# Patient Record
Sex: Female | Born: 1995 | Race: White | Hispanic: No | Smoking: Never smoker
Health system: Southern US, Community
[De-identification: ages and names within clinical notes are randomized; demographics above are authoritative.]

## PROBLEM LIST (undated history)

## (undated) DIAGNOSIS — I10 Essential (primary) hypertension: Secondary | ICD-10-CM

## (undated) DIAGNOSIS — G43909 Migraine, unspecified, not intractable, without status migrainosus: Secondary | ICD-10-CM

## (undated) DIAGNOSIS — E119 Type 2 diabetes mellitus without complications: Secondary | ICD-10-CM

## (undated) DIAGNOSIS — J189 Pneumonia, unspecified organism: Secondary | ICD-10-CM

## (undated) DIAGNOSIS — J45909 Unspecified asthma, uncomplicated: Secondary | ICD-10-CM

## (undated) DIAGNOSIS — E282 Polycystic ovarian syndrome: Secondary | ICD-10-CM

## (undated) HISTORY — PX: OTHER SURGICAL HISTORY: SHX169

---

## 2020-04-23 ENCOUNTER — Emergency Department (HOSPITAL_COMMUNITY)
Admission: EM | Admit: 2020-04-23 | Discharge: 2020-04-24 | Disposition: A | Discharge status: Home / Self Care | Payer: No Typology Code available for payment source | Attending: Emergency Medicine | Admitting: Emergency Medicine

## 2020-04-23 ENCOUNTER — Emergency Department (HOSPITAL_COMMUNITY): Payer: No Typology Code available for payment source

## 2020-04-23 DIAGNOSIS — M79604 Pain in right leg: Secondary | ICD-10-CM

## 2020-04-23 DIAGNOSIS — M25559 Pain in unspecified hip: Secondary | ICD-10-CM

## 2020-04-23 DIAGNOSIS — M25561 Pain in right knee: Secondary | ICD-10-CM | POA: Diagnosis not present

## 2020-04-23 DIAGNOSIS — M25571 Pain in right ankle and joints of right foot: Secondary | ICD-10-CM | POA: Diagnosis not present

## 2020-04-23 DIAGNOSIS — W109XXA Fall (on) (from) unspecified stairs and steps, initial encounter: Secondary | ICD-10-CM

## 2020-04-23 DIAGNOSIS — W108XXA Fall (on) (from) other stairs and steps, initial encounter: Secondary | ICD-10-CM | POA: Diagnosis not present

## 2020-04-23 LAB — I-STAT BETA HCG BLOOD, ED (MC, WL, AP ONLY): I-stat hCG, quantitative: <5 m[IU]/mL (ref <5)

## 2020-04-23 NOTE — ED Triage Notes (Signed)
Pt reports trip over an stair and landed on right side of body, Pt is complaining pf right ankle, Knee and hip pain. Pt denies any LOC during fall

## 2020-04-24 DIAGNOSIS — M79604 Pain in right leg: Secondary | ICD-10-CM | POA: Diagnosis not present

## 2020-04-24 NOTE — Discharge Instructions (Signed)
Tylenol or motrin for pain.  Can try to elevate and ice affected areas. Use crutches for now, progress back to weight bearing as tolerated. Follow-up with orthopedics if needed, otherwise can see your primary care doctor. Return here for new concerns.

## 2020-04-24 NOTE — ED Provider Notes (Signed)
[facility] EMERGENCY DEPARTMENT Provider Note   CSN: [id number] Arrival date & time: 04/23/20  2221     History Chief Complaint  Patient presents with  . Ankle Pain  . Knee Pain  . Fall  . Hip Pain    Susan Butler is a 25 y.o. female.  The history is provided by the patient and medical records.  Ankle Pain Knee Pain Fall  Hip Pain   24 y.o. F presenting to the ED for right leg pain after a fall.  Patient states she was messing around with a friend, lost her footing and fell into some steps with her right side.  States majority of impact on right lower leg.  No head injury or LOC.  States majority of pain in right knee and ankle but hip started hurting afterwards.  She states she cannot bear weight on right ankle at all now.  Denies numbness/weakness.  She has had some ankle sprains on right side before.  No meds PTA.  No past medical history on file.  There are no problems to display for this patient.    OB History   No obstetric history on file.     No family history on file.     Home Medications Prior to Admission medications   Not on File    Allergies    Patient has no known allergies.  Review of Systems   Review of Systems  Musculoskeletal: Positive for arthralgias.  All other systems reviewed and are negative.   Physical Exam Updated Vital Signs BP (!) 138/104   Pulse 89   Temp 98.2 F (36.8 C)   Resp 20   Ht 5\' 11"  (1.803 m)   Wt 104.3 kg   SpO2 100%   BMI 32.08 kg/m   Physical Exam Vitals and nursing note reviewed.  Constitutional:      Appearance: She is well-developed. She is obese.     Comments: obese  HENT:     Head: Normocephalic and atraumatic.  Eyes:     Conjunctiva/sclera: Conjunctivae normal.     Pupils: Pupils are equal, round, and reactive to light.  Cardiovascular:     Rate and Rhythm: Normal rate and regular rhythm.     Heart sounds: Normal heart sounds.  Pulmonary:     Effort:  Pulmonary effort is normal. No respiratory distress.     Breath sounds: Normal breath sounds. No rhonchi.  Abdominal:     General: Bowel sounds are normal.     Palpations: Abdomen is soft.     Tenderness: There is no abdominal tenderness. There is no rebound.  Musculoskeletal:        General: Normal range of motion.     Cervical back: Normal range of motion.     Comments: Right leg normal in appearance, no significant swelling or bony deformities present, no leg shortening, pain elicited with attempted ROM of the right knee and ankle, DP pulse intact, moving toes normally  Skin:    General: Skin is warm and dry.  Neurological:     Mental Status: She is alert and oriented to person, place, and time.     ED Results / Procedures / Treatments   Labs (all labs ordered are listed, but only abnormal results are displayed) Labs Reviewed  I-STAT BETA HCG BLOOD, ED ([facility], [facility], [facility] ONLY)    EKG None  Radiology DG Ankle Complete Right  Result Date: 04/24/2020 CLINICAL DATA:  25 year old female with right lower  extremity pain. EXAM: RIGHT ANKLE - COMPLETE 3+ VIEW COMPARISON:  None. FINDINGS: There is no evidence of fracture, dislocation, or joint effusion. There is no evidence of arthropathy or other focal bone abnormality. Soft tissues are unremarkable. IMPRESSION: Negative. Electronically Signed   By: Elgie Collard M.D.   On: 04/24/2020 00:15   DG Knee Complete 4 Views Right  Result Date: 04/24/2020 CLINICAL DATA:  Fall EXAM: RIGHT KNEE - COMPLETE 4+ VIEW COMPARISON:  None. FINDINGS: No evidence of fracture, dislocation, or joint effusion. Benign bone island in the proximal tibia. No evidence of arthropathy or other focal bone abnormality. Soft tissues are unremarkable. IMPRESSION: Negative. Electronically Signed   By: Kreg Shropshire M.D.   On: 04/24/2020 00:22   DG HIP UNILAT WITH PELVIS 2-3 VIEWS RIGHT  Result Date: 04/24/2020 CLINICAL DATA:  Fall, right hip pain EXAM: DG HIP (WITH OR  WITHOUT PELVIS) 2-3V RIGHT COMPARISON:  None. FINDINGS: There is no evidence of hip fracture or dislocation. There is no evidence of arthropathy or other focal bone abnormality. IMPRESSION: Negative. Electronically Signed   By: Kreg Shropshire M.D.   On: 04/24/2020 00:15    Procedures Procedures   Medications Ordered in ED Medications - No data to display  ED Course  I have reviewed the triage vital signs and the nursing notes.  Pertinent labs & imaging results that were available during my care of the patient were reviewed by me and considered in my medical decision making (see chart for details).    MDM Rules/Calculators/A&P  25 year old female presenting to the ED after a fall.  She lost her footing and fell into some steps, majority of impact on her right lower leg.  There was no head injury or loss of consciousness.  She complains of pain diffusely throughout the right leg but there is no leg shortening or bony deformity present.  Majority of her pain seems localized to right knee and ankle, pain is elicited with attempted range of motion.  Her leg is neurovascularly intact.  X-ray of right hip, knee, and ankle obtained and are all negative.  Suspect sprain type injuries.  She was placed in ASO, given knee sleeve, and crutches.  Will progress back to full weightbearing as tolerated.  She was given orthopedic follow-up if not improving, otherwise can see PCP.  Tylenol Motrin for pain, ice, elevation.  May return here for any new or acute changes.  Final Clinical Impression(s) / ED Diagnoses Final diagnoses:  Fall on stairs, initial encounter  Right leg pain    Rx / DC Orders ED Discharge Orders    None       Garlon Hatchet, PA-C 04/24/20 0417    Charlynne Pander, MD 04/24/20 [phone]

## 2020-04-24 NOTE — Progress Notes (Signed)
Orthopedic Tech Progress Note Patient Details:  Susan Butler [facility] 10-Mar-1995 [id number]  Ortho Devices Type of Ortho Device: ASO,Knee Sleeve,Crutches Ortho Device/Splint Location: Right Lower Extremity Ortho Device/Splint Interventions: Ordered,Application,Adjustment   Post Interventions Patient Tolerated: Well Instructions Provided: Adjustment of device,Care of device,Poper ambulation with device   [name] P [name] 04/24/2020, 4:20 AM

## 2020-04-24 NOTE — ED Notes (Signed)
Patient verbalizes understanding of discharge instructions. Opportunity for questioning and answers were provided. Armband removed by staff, pt discharged from ED via wheelchair.  

## 2020-06-14 DIAGNOSIS — F902 Attention-deficit hyperactivity disorder, combined type: Secondary | ICD-10-CM | POA: Diagnosis not present

## 2020-06-14 DIAGNOSIS — F4310 Post-traumatic stress disorder, unspecified: Secondary | ICD-10-CM | POA: Diagnosis not present

## 2020-06-22 DIAGNOSIS — I10 Essential (primary) hypertension: Secondary | ICD-10-CM | POA: Diagnosis not present

## 2020-06-22 DIAGNOSIS — R945 Abnormal results of liver function studies: Secondary | ICD-10-CM | POA: Diagnosis not present

## 2020-06-22 DIAGNOSIS — R000 Tachycardia, unspecified: Secondary | ICD-10-CM | POA: Diagnosis not present

## 2020-06-22 DIAGNOSIS — Z888 Allergy status to other drugs, medicaments and biological substances status: Secondary | ICD-10-CM | POA: Diagnosis not present

## 2020-06-22 DIAGNOSIS — R519 Headache, unspecified: Secondary | ICD-10-CM | POA: Diagnosis not present

## 2020-06-22 DIAGNOSIS — K760 Fatty (change of) liver, not elsewhere classified: Secondary | ICD-10-CM | POA: Diagnosis not present

## 2020-06-22 DIAGNOSIS — R600 Localized edema: Secondary | ICD-10-CM | POA: Diagnosis not present

## 2020-06-22 DIAGNOSIS — Z91030 Bee allergy status: Secondary | ICD-10-CM | POA: Diagnosis not present

## 2020-06-22 DIAGNOSIS — R7401 Elevation of levels of liver transaminase levels: Secondary | ICD-10-CM | POA: Diagnosis not present

## 2020-06-22 DIAGNOSIS — K5730 Diverticulosis of large intestine without perforation or abscess without bleeding: Secondary | ICD-10-CM | POA: Diagnosis not present

## 2020-06-22 DIAGNOSIS — Z91040 Latex allergy status: Secondary | ICD-10-CM | POA: Diagnosis not present

## 2020-06-22 DIAGNOSIS — R112 Nausea with vomiting, unspecified: Secondary | ICD-10-CM | POA: Diagnosis not present

## 2020-06-23 DIAGNOSIS — K5730 Diverticulosis of large intestine without perforation or abscess without bleeding: Secondary | ICD-10-CM | POA: Diagnosis not present

## 2020-06-23 DIAGNOSIS — Z91030 Bee allergy status: Secondary | ICD-10-CM | POA: Diagnosis not present

## 2020-06-23 DIAGNOSIS — R945 Abnormal results of liver function studies: Secondary | ICD-10-CM | POA: Diagnosis not present

## 2020-06-23 DIAGNOSIS — R000 Tachycardia, unspecified: Secondary | ICD-10-CM | POA: Diagnosis not present

## 2020-06-23 DIAGNOSIS — K760 Fatty (change of) liver, not elsewhere classified: Secondary | ICD-10-CM | POA: Diagnosis not present

## 2020-06-23 DIAGNOSIS — Z888 Allergy status to other drugs, medicaments and biological substances status: Secondary | ICD-10-CM | POA: Diagnosis not present

## 2020-06-23 DIAGNOSIS — R600 Localized edema: Secondary | ICD-10-CM | POA: Diagnosis not present

## 2020-06-23 DIAGNOSIS — Z91040 Latex allergy status: Secondary | ICD-10-CM | POA: Diagnosis not present

## 2020-06-23 DIAGNOSIS — R519 Headache, unspecified: Secondary | ICD-10-CM | POA: Diagnosis not present

## 2020-07-18 ENCOUNTER — Encounter (HOSPITAL_COMMUNITY): Payer: Self-pay

## 2020-07-18 ENCOUNTER — Emergency Department (HOSPITAL_COMMUNITY)
Admission: EM | Admit: 2020-07-18 | Discharge: 2020-07-19 | Disposition: A | Discharge status: Home / Self Care | Payer: Medicaid Other | Attending: Emergency Medicine | Admitting: Emergency Medicine

## 2020-07-18 ENCOUNTER — Emergency Department (HOSPITAL_COMMUNITY): Payer: Medicaid Other

## 2020-07-18 DIAGNOSIS — S8992XA Unspecified injury of left lower leg, initial encounter: Secondary | ICD-10-CM | POA: Diagnosis not present

## 2020-07-18 DIAGNOSIS — S99912A Unspecified injury of left ankle, initial encounter: Secondary | ICD-10-CM | POA: Insufficient documentation

## 2020-07-18 DIAGNOSIS — W19XXXA Unspecified fall, initial encounter: Secondary | ICD-10-CM

## 2020-07-18 DIAGNOSIS — W010XXA Fall on same level from slipping, tripping and stumbling without subsequent striking against object, initial encounter: Secondary | ICD-10-CM | POA: Insufficient documentation

## 2020-07-18 DIAGNOSIS — Y92512 Supermarket, store or market as the place of occurrence of the external cause: Secondary | ICD-10-CM | POA: Diagnosis not present

## 2020-07-18 NOTE — ED Triage Notes (Signed)
Pt states that she was at [employer] and slipped on some water and hurt her L knee and L ankle

## 2020-07-19 ENCOUNTER — Ambulatory Visit (HOSPITAL_COMMUNITY)
Admission: EM | Admit: 2020-07-19 | Discharge: 2020-07-19 | Disposition: A | Discharge status: Home / Self Care | Payer: Medicaid Other | Attending: Emergency Medicine | Admitting: Emergency Medicine

## 2020-07-19 ENCOUNTER — Other Ambulatory Visit: Payer: Self-pay

## 2020-07-19 ENCOUNTER — Encounter (HOSPITAL_COMMUNITY): Payer: Self-pay

## 2020-07-19 DIAGNOSIS — S8392XA Sprain of unspecified site of left knee, initial encounter: Secondary | ICD-10-CM | POA: Diagnosis not present

## 2020-07-19 DIAGNOSIS — M25572 Pain in left ankle and joints of left foot: Secondary | ICD-10-CM

## 2020-07-19 MED ORDER — NAPROXEN 500 MG PO TABS: 500.0000 mg | ORAL_TABLET | Freq: Two times a day (BID) | ORAL | 0 refills | Status: DC

## 2020-07-19 NOTE — ED Provider Notes (Signed)
[facility] EMERGENCY DEPARTMENT Provider Note   CSN: [id number] Arrival date & time: 07/18/20  2130     History Chief Complaint  Patient presents with   Susan Butler is a 25 y.o. female.  The history is provided by the patient and medical records.  Fall  24 y.o. F here after she slipped on some water at [facility] and injured left knee and ankle.  Struck her elbow on a wall but that just feels sore.  There was no head injury or LOC.  States just wanted to make sure she didn't break anything.  She remains ambulatory.  No intervention tried PTA.  History reviewed. No pertinent past medical history.  There are no problems to display for this patient.   History reviewed. No pertinent surgical history.   OB History   No obstetric history on file.     No family history on file.     Home Medications Prior to Admission medications   Not on File    Allergies    Bee venom, Latex, Lithium, and Seroquel [quetiapine]  Review of Systems   Review of Systems  Musculoskeletal:  Positive for arthralgias.  All other systems reviewed and are negative.  Physical Exam Updated Vital Signs BP (!) 135/93 (BP Location: Right Arm)   Pulse 81   Temp 98 F (36.7 C) (Oral)   Resp 17   SpO2 98%   Physical Exam Vitals and nursing note reviewed.  Constitutional:      Appearance: She is well-developed. She is obese.  HENT:     Head: Normocephalic and atraumatic.  Eyes:     Conjunctiva/sclera: Conjunctivae normal.     Pupils: Pupils are equal, round, and reactive to light.  Cardiovascular:     Rate and Rhythm: Normal rate and regular rhythm.     Heart sounds: Normal heart sounds.  Pulmonary:     Effort: Pulmonary effort is normal.     Breath sounds: Normal breath sounds.  Abdominal:     General: Bowel sounds are normal.     Palpations: Abdomen is soft.  Musculoskeletal:        General: Normal range of motion.     Cervical back: Normal  range of motion.     Comments: Left leg is entirely atraumatic without any swelling or bony deformities, no pain elicited with ROM, DP pulse intact, good distal strength and sensation, normal gait Elbow normal in appearance, no pain with ROM, radial pulse intact  Skin:    General: Skin is warm and dry.  Neurological:     Mental Status: She is alert and oriented to person, place, and time.    ED Results / Procedures / Treatments   Labs (all labs ordered are listed, but only abnormal results are displayed) Labs Reviewed - No data to display  EKG None  Radiology DG Ankle 2 Views Left  Result Date: 07/18/2020 CLINICAL DATA:  Left knee and ankle pain EXAM: LEFT ANKLE - 2 VIEW COMPARISON:  None. FINDINGS: There is no evidence of fracture, dislocation, or joint effusion. There is no evidence of arthropathy or other focal bone abnormality. Soft tissues are unremarkable. IMPRESSION: Negative. Electronically Signed   By: Charlett Nose M.D.   On: 07/18/2020 22:15   DG Knee Complete 4 Views Left  Result Date: 07/18/2020 CLINICAL DATA:  Left knee and ankle pain EXAM: LEFT KNEE - COMPLETE 4+ VIEW COMPARISON:  None. FINDINGS: No evidence of fracture, dislocation, or  joint effusion. No evidence of arthropathy or other focal bone abnormality. Soft tissues are unremarkable. IMPRESSION: Negative. Electronically Signed   By: Charlett Nose M.D.   On: 07/18/2020 22:15    Procedures Procedures   Medications Ordered in ED Medications - No data to display  ED Course  I have reviewed the triage vital signs and the nursing notes.  Pertinent labs & imaging results that were available during my care of the patient were reviewed by me and considered in my medical decision making (see chart for details).    MDM Rules/Calculators/A&P  25 y.o. F here after fall at [employer] when she slipped in water.  No head injury or LOC.  Mostly complains of pain in left knee and ankle.  Her exam is entirely atraumatic  without any noted swelling, bruising, or acute signs of trauma.  She remains ambulatory with steady gait.  Imaging is negative, patient reassured.  Plan to d/c home with supportive care and PCP follow-up.  Return here for new concerns.  Final Clinical Impression(s) / ED Diagnoses Final diagnoses:  Fall, initial encounter    Rx / DC Orders ED Discharge Orders          Ordered    naproxen (NAPROSYN) 500 MG tablet  2 times daily with meals        07/19/20 0334             Garlon Hatchet, PA-C 07/19/20 1155    Milagros Loll, MD 07/20/20 [phone]

## 2020-07-19 NOTE — Discharge Instructions (Addendum)
Take the prescribed medication as directed for pain/soreness.  This should improve in the next few days. Can follow-up with your primary care doctor. Return to the ED for new or worsening symptoms.

## 2020-07-19 NOTE — Discharge Instructions (Addendum)
Wear the knee brace/ace bandage for comfort.    Take the Naproxen twice a day for pain.    Rest as much as possible Ice for 10-15 minutes every 4-6 hours as needed for pain and swelling Compression- use an ace bandage or splint for comfort Elevate above your hip/heart when sitting and laying down  Follow up with sports medicine or orthopedics if symptoms do not improve in the next few days.

## 2020-07-19 NOTE — ED Triage Notes (Signed)
Pt presents with knee and ankle pain x 2 days. Pt states the pain has gotten worse. She was given an anti-inflammatory that has not given her relief. She states it hurts to bend her knee and walk.

## 2020-07-19 NOTE — ED Provider Notes (Signed)
[facility]    CSN: [id number] Arrival date & time: 07/19/20  1757      History   Chief Complaint Chief Complaint  Patient presents with   Ankle Pain   Knee Pain    HPI Susan Butler is a 25 y.o. female.   Patient here for evaluation of left knee and ankle pain after falling 2 days ago.  Reports being evaluated in the emergency room yesterday and was given anti-inflammatories and told that nothing was broken.  Reports knee feels unstable and "gave out" earlier today.  Reports pain primarily to the outside of her knee that is worse when walking and bearing weight.   Denies any fevers, chest pain, shortness of breath, N/V/D, numbness, tingling, weakness, abdominal pain, or headaches.    The history is provided by the patient.  Ankle Pain Knee Pain  History reviewed. No pertinent past medical history.  There are no problems to display for this patient.   History reviewed. No pertinent surgical history.  OB History   No obstetric history on file.      Home Medications    Prior to Admission medications   Medication Sig Start Date End Date Taking? Authorizing Provider  naproxen (NAPROSYN) 500 MG tablet Take 1 tablet (500 mg total) by mouth 2 (two) times daily with a meal. 07/19/20   Garlon Hatchet, PA-C    Family History History reviewed. No pertinent family history.  Social History Social History   Tobacco Use   Smoking status: Never   Smokeless tobacco: Never  Substance Use Topics   Alcohol use: Never   Drug use: Never     Allergies   Bee venom, Latex, Lithium, and Seroquel [quetiapine]   Review of Systems Review of Systems  Musculoskeletal:  Positive for arthralgias and joint swelling.  All other systems reviewed and are negative.   Physical Exam Triage Vital Signs ED Triage Vitals  Enc Vitals Group     BP 07/19/20 1812 (!) 148/96     Pulse Rate 07/19/20 1812 100     Resp 07/19/20 1812 17     Temp 07/19/20 1812 98.4 F  (36.9 C)     Temp Source 07/19/20 1812 Oral     SpO2 07/19/20 1812 98 %     Weight --      Height --      Head Circumference --      Peak Flow --      Pain Score 07/19/20 1810 7     Pain Loc --      Pain Edu? --      Excl. in GC? --    No data found.  Updated Vital Signs BP (!) 148/96 (BP Location: Right Arm)   Pulse 100   Temp 98.4 F (36.9 C) (Oral)   Resp 17   LMP 04/07/2020 (Exact Date)   SpO2 98%   Visual Acuity Right Eye Distance:   Left Eye Distance:   Bilateral Distance:    Right Eye Near:   Left Eye Near:    Bilateral Near:     Physical Exam Vitals and nursing note reviewed.  Constitutional:      General: She is not in acute distress.    Appearance: Normal appearance. She is not ill-appearing, toxic-appearing or diaphoretic.  HENT:     Head: Normocephalic and atraumatic.  Eyes:     Conjunctiva/sclera: Conjunctivae normal.  Cardiovascular:     Rate and Rhythm: Normal rate.  Pulses: Normal pulses.  Pulmonary:     Effort: Pulmonary effort is normal.  Abdominal:     General: Abdomen is flat.  Musculoskeletal:     Cervical back: Normal range of motion.     Left knee: No deformity, bony tenderness or crepitus. Decreased range of motion. Tenderness present over the lateral joint line. Normal alignment, normal meniscus and normal patellar mobility. Normal pulse.     Right ankle: Normal.     Left ankle: No swelling or deformity. Tenderness present. Normal range of motion. Anterior drawer test negative. Normal pulse.     Left Achilles Tendon: Normal.  Skin:    General: Skin is warm and dry.  Neurological:     General: No focal deficit present.     Mental Status: She is alert and oriented to person, place, and time.  Psychiatric:        Mood and Affect: Mood normal.     UC Treatments / Results  Labs (all labs ordered are listed, but only abnormal results are displayed) Labs Reviewed - No data to display  EKG   Radiology DG Ankle 2 Views  Left  Result Date: 07/18/2020 CLINICAL DATA:  Left knee and ankle pain EXAM: LEFT ANKLE - 2 VIEW COMPARISON:  None. FINDINGS: There is no evidence of fracture, dislocation, or joint effusion. There is no evidence of arthropathy or other focal bone abnormality. Soft tissues are unremarkable. IMPRESSION: Negative. Electronically Signed   By: Charlett Nose M.D.   On: 07/18/2020 22:15   DG Knee Complete 4 Views Left  Result Date: 07/18/2020 CLINICAL DATA:  Left knee and ankle pain EXAM: LEFT KNEE - COMPLETE 4+ VIEW COMPARISON:  None. FINDINGS: No evidence of fracture, dislocation, or joint effusion. No evidence of arthropathy or other focal bone abnormality. Soft tissues are unremarkable. IMPRESSION: Negative. Electronically Signed   By: Charlett Nose M.D.   On: 07/18/2020 22:15    Procedures Procedures (including critical care time)  Medications Ordered in UC Medications - No data to display  Initial Impression / Assessment and Plan / UC Course  I have reviewed the triage vital signs and the nursing notes.  Pertinent labs & imaging results that were available during my care of the patient were reviewed by me and considered in my medical decision making (see chart for details).    Assessment negative for red flags or concerns.  X-rays yesterday with no bony abnormality or fractures.  This is possibly a sprain of the left knee.  Knee sleeve applied in office for support.  Instructed patient to take naproxen as prescribed yesterday.  Wearing sleeve for comfort.  Encouraged rest, ice, and elevation.  If knee pain is not improving or worsening over the next several days recommend following up with orthopedics. Final Clinical Impressions(s) / UC Diagnoses   Final diagnoses:  Sprain of left knee, unspecified ligament, initial encounter  Acute left ankle pain     Discharge Instructions      Wear the knee brace/ace bandage for comfort.    Take the Naproxen twice a day for pain.    Rest as much  as possible Ice for 10-15 minutes every 4-6 hours as needed for pain and swelling Compression- use an ace bandage or splint for comfort Elevate above your hip/heart when sitting and laying down  Follow up with sports medicine or orthopedics if symptoms do not improve in the next few days.      ED Prescriptions   None  PDMP not reviewed this encounter.   Ivette Loyal, NP 07/19/20 1849

## 2020-08-18 ENCOUNTER — Ambulatory Visit: Payer: No Typology Code available for payment source | Admitting: Obstetrics and Gynecology

## 2020-08-28 ENCOUNTER — Ambulatory Visit (HOSPITAL_COMMUNITY)
Admission: EM | Admit: 2020-08-28 | Discharge: 2020-08-28 | Disposition: A | Discharge status: Home / Self Care | Payer: Medicaid Other | Attending: Behavioral Health | Admitting: Behavioral Health

## 2020-08-28 ENCOUNTER — Encounter: Payer: Self-pay | Admitting: Psychiatry

## 2020-08-28 ENCOUNTER — Other Ambulatory Visit: Payer: Self-pay

## 2020-08-28 DIAGNOSIS — R45851 Suicidal ideations: Secondary | ICD-10-CM | POA: Diagnosis not present

## 2020-08-28 DIAGNOSIS — Z9151 Personal history of suicidal behavior: Secondary | ICD-10-CM | POA: Diagnosis not present

## 2020-08-28 DIAGNOSIS — Z9152 Personal history of nonsuicidal self-harm: Secondary | ICD-10-CM | POA: Diagnosis not present

## 2020-08-28 DIAGNOSIS — F331 Major depressive disorder, recurrent, moderate: Secondary | ICD-10-CM

## 2020-08-28 DIAGNOSIS — F339 Major depressive disorder, recurrent, unspecified: Secondary | ICD-10-CM | POA: Insufficient documentation

## 2020-08-28 NOTE — BH Assessment (Signed)
Pt to [facility] voluntarily reporting that she was at her PCP for medications but there  wasn't a provider available so they referred her here for medications. Pt has been off her medications for 6 months (Latuda and Clonidine). Pt reports worsening depression for the past two weeks. Reports dx of Bipolar, anxiety, and PTSD. Pt reports SI on and off since she was 25 yo, however denies a plan. Pt denies HI, AVH and substance use.   Pt is Urgent.
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
## 2020-08-28 NOTE — ED Provider Notes (Addendum)
Behavioral Health Urgent Care Medical Screening Exam  Patient Name: Susan Butler MRN: [id number] Date of Evaluation: 08/28/20 Chief Complaint:   Diagnosis:  Final diagnoses:  MDD (major depressive disorder), recurrent episode, moderate (HCC)    History of Present illness: Susan Butler is a 25 y.o. female patient presented to [facility] as a walk in alone states her spouse is in the car.  She has complaints of "worsening depression".  Susan Butler, 25 y.o., female patient seen face to face by this provider, consulted with Dr. Lucianne Muss; and chart reviewed on 08/28/20.  On evaluation Susan Butler reports she has not been on her medications for over 6 months.  She is requesting to be restarted on medications today.    During evaluation Susan Butler is in sitting position.  She makes fleeting eye contact in no acute distress.  She is initially withdrawn.  She is alert, oriented x 4 and cooperative.  Reports a decrease in sleep and appetite.  States she has not slept in 2 days.  Her mood is depressed with congruent affect.  Patient becomes agitated during the interview with questions.  She is anxious with how long it is taking and ask multiple times "how much longer is this going to take".  She does not appear to be responding to internal/external stimuli or delusional thoughts.  Patient denies paranoia and psychosis.  Denies homicidal ideations denies auditory and visual hallucinations.  Endorses self-harm.  States she cut her arms 2 days ago with a pocket knife.  States it was not in a suicide attempt.  States in the past she has cut when she gets stressed.  Endorses suicidal ideations.  Currently does not have intent, plan, or access to means.  Reports she has a history of an inpatient psychiatric admission in October 2021 when she overdosed on medications.  States she has not taken medications in over 6 months because she does not like how medications make her  feel nor does she like attending outpatient psychiatric visits.  Patient states she currently does not have a outpatient psychiatric services in place.  Denies alcohol or substance use.  When asking if collateral could be obtained from her spouse, patient became irritated and stated, "no he will want me to stay here".  Patient left evaluation walked into the parking lot and drove away.  Roughly 10 to 15 minutes later patient's spouse presented himself and requested to speak to this Clinical research associate.  Spouse states patient has had 1 inpatient psychiatric admission.  He states he does not believe she is a harm to herself at this time.  States that she just needs to be restarted on medications.  States he does not work and they are passing through states they are from [place].  States he is with the patient 24/7 and he will monitor her safety.  States she has no access to weapons including knives/firearms/razors.  States she does not have access to medications.  States he keeps medications locked up and gives her medications daily.  This Clinical research associate ask spouse if he could talk to the patient and have her come back into the facility to complete the assessment.  Spouse agree.  Spouse was calm and had no immediate safety concerns with patient returning home.  Roughly 10 minutes later patient presented again to the facility assessment was completed.  patient contracted for safety.  Patient requested Latuda 40 mg twice daily and clonidine 0.5 mg.  Patient was pacing in the room.  When this provider ask for clarification that the patient was asking for clonidine and not Klonopin, explained that clonidine does not come in 0.5 mg patient became irritated and stated she was ready to go.  Explained we do not prescribe controlled substances at this facility.  Stated she no longer wanted any medications and requested to leave patient was discharged and left with her husband.  It is unclear but it is possible that patient could be  medication seeking.  Psychiatric Specialty Exam  Presentation  General Appearance:Fairly Groomed  Eye Contact:Good  Speech:Clear and Coherent; Normal Rate  Speech Volume:Normal  Handedness:Right   Mood and Affect  Mood:Anxious; Depressed; Worthless; Hopeless  Affect:Congruent   Thought Process  Thought Processes:Coherent  Descriptions of Associations:Intact  Orientation:No data recorded Thought Content:Logical    Hallucinations:None  Ideas of Reference:None  Suicidal Thoughts:Yes, Passive  Homicidal Thoughts:No   Sensorium  Memory:Immediate Good; Recent Good; Remote Good  Judgment:Poor  Insight:Fair; Poor   Executive Functions  Concentration:Good  Attention Span:Good  Recall:Good  Fund of Knowledge:Good  Language:Good   Psychomotor Activity  Psychomotor Activity:Normal   Assets  Assets:Communication Skills; Financial Resources/Insurance; Housing; Social Support; Resilience   Sleep  Sleep:Fair  Number of hours: 5   No data recorded  Physical Exam: Physical Exam Vitals and nursing note reviewed.  Constitutional:      General: She is not in acute distress.    Appearance: She is obese. She is not ill-appearing.  HENT:     Head: Normocephalic.  Eyes:     Conjunctiva/sclera: Conjunctivae normal.  Cardiovascular:     Rate and Rhythm: Normal rate.  Pulmonary:     Effort: Pulmonary effort is normal. No respiratory distress.  Musculoskeletal:        General: Normal range of motion.     Cervical back: Normal range of motion.  Neurological:     Mental Status: She is alert and oriented to person, place, and time.  Psychiatric:        Attention and Perception: Attention and perception normal.        Mood and Affect: Mood is anxious and depressed. Affect is flat.        Speech: Speech normal.        Behavior: Behavior is withdrawn. Behavior is cooperative.        Thought Content: Thought content includes suicidal ideation. Thought  content does not include suicidal plan.        Cognition and Memory: Cognition normal.        Judgment: Judgment is impulsive.   Review of Systems  Constitutional: Negative.  Negative for fever.  HENT: Negative.  Negative for hearing loss.   Eyes: Negative.   Respiratory: Negative.  Negative for cough.   Cardiovascular: Negative.  Negative for chest pain.  Musculoskeletal: Negative.   Skin: Negative.   Neurological: Negative.   Psychiatric/Behavioral:  Positive for depression and suicidal ideas. The patient is nervous/anxious.   Blood pressure (!) 150/80, pulse 89, temperature 98.4 F (36.9 C), temperature source Oral, resp. rate 16, SpO2 98 %. There is no height or weight on file to calculate BMI.  Musculoskeletal: Strength & Muscle Tone: within normal limits Gait & Station: normal Patient leans: N/A   [facility] Discharge Disposition for Follow up and Recommendations:  Discharge patient.  Patient does not meet inpatient psychiatric admission criteria  Based on my evaluation the patient does not appear to have an emergency medical condition and can be discharged with resources and follow up  care in outpatient services for Medication Management and Individual Therapy  Patient states even though she is from [place] she plans to be a [facility] resident.  Explained if she is a [facility] resident she can receive outpatient psychiatric services with behavioral health on the second floor of this building.  Provided open access walk-in hours.   Ardis Hughs, NP 08/28/2020, 11:43 PM

## 2020-08-28 NOTE — Discharge Instructions (Addendum)
?  In the event of worsening symptoms, patient is instructed to call the crisis hotline, 911 and or go to the nearest ED for appropriate evaluation and treatment of symptoms. ?To follow-up with his/her primary care provider for your other medical issues, concerns and or health care needs. ? ? ?   ?

## 2020-08-30 ENCOUNTER — Encounter (HOSPITAL_COMMUNITY): Payer: Self-pay

## 2020-08-30 ENCOUNTER — Emergency Department (HOSPITAL_COMMUNITY)
Admission: EM | Admit: 2020-08-30 | Discharge: 2020-08-30 | Disposition: A | Discharge status: Home / Self Care | Payer: Medicaid Other | Attending: Emergency Medicine | Admitting: Emergency Medicine

## 2020-08-30 ENCOUNTER — Other Ambulatory Visit: Payer: Self-pay

## 2020-08-30 ENCOUNTER — Emergency Department (HOSPITAL_COMMUNITY): Payer: Medicaid Other

## 2020-08-30 DIAGNOSIS — H538 Other visual disturbances: Secondary | ICD-10-CM | POA: Diagnosis not present

## 2020-08-30 DIAGNOSIS — Z20822 Contact with and (suspected) exposure to covid-19: Secondary | ICD-10-CM | POA: Diagnosis not present

## 2020-08-30 DIAGNOSIS — Z91040 Latex allergy status: Secondary | ICD-10-CM | POA: Insufficient documentation

## 2020-08-30 DIAGNOSIS — J45909 Unspecified asthma, uncomplicated: Secondary | ICD-10-CM | POA: Diagnosis not present

## 2020-08-30 DIAGNOSIS — I10 Essential (primary) hypertension: Secondary | ICD-10-CM | POA: Diagnosis not present

## 2020-08-30 DIAGNOSIS — E119 Type 2 diabetes mellitus without complications: Secondary | ICD-10-CM | POA: Diagnosis not present

## 2020-08-30 DIAGNOSIS — R42 Dizziness and giddiness: Secondary | ICD-10-CM | POA: Diagnosis not present

## 2020-08-30 DIAGNOSIS — R519 Headache, unspecified: Secondary | ICD-10-CM | POA: Insufficient documentation

## 2020-08-30 HISTORY — DX: Type 2 diabetes mellitus without complications: E11.9

## 2020-08-30 HISTORY — DX: Unspecified asthma, uncomplicated: J45.909

## 2020-08-30 HISTORY — DX: Essential (primary) hypertension: I10

## 2020-08-30 HISTORY — DX: Migraine, unspecified, not intractable, without status migrainosus: G43.909

## 2020-08-30 LAB — I-STAT CHEM 8, ED
BUN: 15 mg/dL (ref 6–20)
Calcium, Ion: 1.19 mmol/L (ref 1.15–1.40)
Chloride: 106 mmol/L (ref 98–111)
Creatinine, Ser: 1 mg/dL (ref 0.44–1.00)
Glucose, Bld: 96 mg/dL (ref 70–99)
HCT: 41 % (ref 36.0–46.0)
Hemoglobin: 13.9 g/dL (ref 12.0–15.0)
Potassium: 4.4 mmol/L (ref 3.5–5.1)
Sodium: 140 mmol/L (ref 135–145)
TCO2: 23 mmol/L (ref 22–32)

## 2020-08-30 LAB — RESP PANEL BY RT-PCR (FLU A&B, COVID) ARPGX2
Influenza A by PCR: NEGATIVE
Influenza B by PCR: NEGATIVE
SARS Coronavirus 2 by RT PCR: NEGATIVE

## 2020-08-30 LAB — POC URINE PREG, ED: Preg Test, Ur: NEGATIVE

## 2020-08-30 MED ORDER — SODIUM CHLORIDE 0.9 % IV BOLUS
1000.0000 mL | Freq: Once | INTRAVENOUS | Status: AC
  Administered 2020-08-30: 1000 mL via INTRAVENOUS

## 2020-08-30 MED ORDER — DEXAMETHASONE SODIUM PHOSPHATE 10 MG/ML IJ SOLN
10.0000 mg | Freq: Once | INTRAMUSCULAR | Status: AC
  Administered 2020-08-30: 10 mg via INTRAVENOUS
  Filled 2020-08-30: qty 1

## 2020-08-30 MED ORDER — KETOROLAC TROMETHAMINE 30 MG/ML IJ SOLN
15.0000 mg | Freq: Once | INTRAMUSCULAR | Status: AC
  Administered 2020-08-30: 15 mg via INTRAVENOUS
  Filled 2020-08-30: qty 1

## 2020-08-30 NOTE — Discharge Instructions (Addendum)
Your work-up today is reassuring.  Recommend follow-up with your primary care provider for recheck.

## 2020-08-30 NOTE — ED Triage Notes (Signed)
Pt was at [facility], c/o severe headache x 1 hr, pt ambulatory and A&O X4. Hx of DM, pt non-compliant w meds  Bgl 93 Bp 153/79 HR 104 O2 94%

## 2020-08-30 NOTE — ED Provider Notes (Signed)
[facility]-EMERGENCY DEPT Provider Note   CSN: [id number] Arrival date & time: 08/30/20  1356     History Chief Complaint  Patient presents with   Headache    Susan Butler is a 25 y.o. female.  The history is provided by the patient. No language interpreter was used.  Headache  25 year old female with significant history of migraine, diabetes, hypertension, asthma who presents complaining of headache.  Patient report approximately an hour ago she was walking around [place] when she developed cute onset of sharp pain to the left side of her head radiates towards her ear and jaw with sensation of dizziness and lightheadedness, left-sided blurry vision.  Symptoms moderate in severity and felt different from her usual migraine that she has had in the past.  She normally takes Topamax but has been without her medication for more than a month.  She denies any fever nausea vomiting neck pain arm numbness or weakness or rash.  She denies any recent strenuous activities.  No specific treatment tried  Past Medical History:  Diagnosis Date   Asthma    Diabetes mellitus without complication (HCC)    Hypertension    Migraines     There are no problems to display for this patient.   Past Surgical History:  Procedure Laterality Date   stenosis       OB History   No obstetric history on file.     No family history on file.  Social History   Tobacco Use   Smoking status: Never   Smokeless tobacco: Never  Vaping Use   Vaping Use: Never used  Substance Use Topics   Alcohol use: Never   Drug use: Never    Home Medications Prior to Admission medications   Medication Sig Start Date End Date Taking? Authorizing Provider  naproxen (NAPROSYN) 500 MG tablet Take 1 tablet (500 mg total) by mouth 2 (two) times daily with a meal. 07/19/20   Garlon Hatchet, PA-C    Allergies    Bee venom, Latex, Lithium, and Seroquel [quetiapine]  Review of Systems    Review of Systems  Neurological:  Positive for headaches.  All other systems reviewed and are negative.  Physical Exam Updated Vital Signs BP (!) 151/110 (BP Location: Left Wrist)   Pulse (!) 106   Temp 98.1 F (36.7 C) (Oral)   Resp 18   Ht 5\' 11"  (1.803 m)   Wt (!) 149.7 kg   LMP 01/31/2020   SpO2 95%   BMI 46.03 kg/m   Physical Exam Vitals and nursing note reviewed.  Constitutional:      General: She is not in acute distress.    Appearance: She is well-developed. She is obese.  HENT:     Head: Atraumatic.  Eyes:     General: No visual field deficit.    Extraocular Movements: Extraocular movements intact.     Conjunctiva/sclera: Conjunctivae normal.     Pupils: Pupils are equal, round, and reactive to light.  Cardiovascular:     Rate and Rhythm: Normal rate and regular rhythm.  Pulmonary:     Effort: Pulmonary effort is normal.  Musculoskeletal:     Cervical back: Normal range of motion and neck supple. No rigidity.  Skin:    Findings: No rash.  Neurological:     Mental Status: She is alert and oriented to person, place, and time.     GCS: GCS eye subscore is 4. GCS verbal subscore is 5. GCS motor  subscore is 6.     Cranial Nerves: No cranial nerve deficit, dysarthria or facial asymmetry.     Sensory: No sensory deficit.     Motor: No weakness.  Psychiatric:        Mood and Affect: Mood normal.    ED Results / Procedures / Treatments   Labs (all labs ordered are listed, but only abnormal results are displayed) Labs Reviewed - No data to display  EKG None  Radiology No results found.  Procedures Procedures   Medications Ordered in ED Medications - No data to display  ED Course  I have reviewed the triage vital signs and the nursing notes.  Pertinent labs & imaging results that were available during my care of the patient were reviewed by me and considered in my medical decision making (see chart for details).    MDM Rules/Calculators/A&P                            BP (!) 151/110 (BP Location: Left Wrist)   Pulse (!) 106   Temp 98.1 F (36.7 C) (Oral)   Resp 18   Ht 5\' 11"  (1.803 m)   Wt (!) 149.7 kg   LMP 01/31/2020   SpO2 95%   BMI 46.03 kg/m   Final Clinical Impression(s) / ED Diagnoses Final diagnoses:  None    Rx / DC Orders ED Discharge Orders     None      2:25 PM Patient developed acute onset of left-sided headache with blurred vision and dizziness that started approximately an hour ago.  She felt that this is different from her usual migraine.  She is within the window use of CT scan to rule out subarachnoid hemorrhage.  CT ordered.  If negative, will provide symptomatic treatment.  3:09 PM Patient signed out to oncoming provider who will follow up on CT result, if negative, patient may benefit from migraine cocktail and reassessment.   02/02/2020, PA-C 08/30/20 1509    09/01/20, MD 08/31/20 1048

## 2020-08-30 NOTE — ED Provider Notes (Signed)
25 year old female with headache not typical of prior migraines. See prior note for complete H&P. Care signed out pending CT head with plan for migraine cocktail if CT normal. Physical Exam  BP (!) 151/110 (BP Location: Left Wrist)   Pulse (!) 106   Temp 98.1 F (36.7 C) (Oral)   Resp 18   Ht 5\' 11"  (1.803 m)   Wt (!) 149.7 kg   LMP 01/31/2020   SpO2 95%   BMI 46.03 kg/m   Physical Exam  ED Course/Procedures     Procedures  MDM  CT head normal. Patient reports ongoing headache. Plan is for IV fluids, Toradol, Decadron with likely dc to follow up with her care team.  Patient's headache has improved with Toradol, Decadron, IV fluids.  Advised to recheck with her PCP.  Return to ED as needed.       02/02/2020, PA-C 08/30/20 1820    09/01/20, MD 08/30/20 [phone]

## 2020-08-30 NOTE — ED Notes (Signed)
Provided pt bus pass

## 2020-10-04 ENCOUNTER — Emergency Department (HOSPITAL_COMMUNITY): Payer: Medicaid Other

## 2020-10-04 ENCOUNTER — Encounter (HOSPITAL_COMMUNITY): Payer: Self-pay | Admitting: Emergency Medicine

## 2020-10-04 ENCOUNTER — Emergency Department (HOSPITAL_COMMUNITY)
Admission: EM | Admit: 2020-10-04 | Discharge: 2020-10-04 | Disposition: A | Discharge status: Home / Self Care | Payer: Medicaid Other | Attending: Emergency Medicine | Admitting: Emergency Medicine

## 2020-10-04 ENCOUNTER — Other Ambulatory Visit: Payer: Self-pay

## 2020-10-04 DIAGNOSIS — I10 Essential (primary) hypertension: Secondary | ICD-10-CM | POA: Insufficient documentation

## 2020-10-04 DIAGNOSIS — W1830XA Fall on same level, unspecified, initial encounter: Secondary | ICD-10-CM | POA: Diagnosis not present

## 2020-10-04 DIAGNOSIS — E119 Type 2 diabetes mellitus without complications: Secondary | ICD-10-CM | POA: Diagnosis not present

## 2020-10-04 DIAGNOSIS — S6992XA Unspecified injury of left wrist, hand and finger(s), initial encounter: Secondary | ICD-10-CM | POA: Diagnosis not present

## 2020-10-04 DIAGNOSIS — Z91040 Latex allergy status: Secondary | ICD-10-CM | POA: Diagnosis not present

## 2020-10-04 DIAGNOSIS — J45909 Unspecified asthma, uncomplicated: Secondary | ICD-10-CM | POA: Insufficient documentation

## 2020-10-04 MED ORDER — IBUPROFEN 400 MG PO TABS
600.0000 mg | ORAL_TABLET | Freq: Once | ORAL | Status: AC
  Administered 2020-10-04: 600 mg via ORAL
  Filled 2020-10-04: qty 1

## 2020-10-04 NOTE — ED Triage Notes (Signed)
Pt states she fell onto her left hand- injuring her left index and thumb finger. She states most of her weight landed on them and she cannot move them. Both fingers are swollen

## 2020-10-04 NOTE — Discharge Instructions (Addendum)
X-rays without any evidence of fracture.  Likely a strain, sprain or soft tissue injury.  Use Velcro wrist brace, ice and elevate, use ibuprofen and Tylenol every 6 hours to help with pain.  If symptoms not improving in 1 week please follow-up with sports medicine.

## 2020-10-04 NOTE — ED Provider Notes (Signed)
[facility] EMERGENCY DEPARTMENT Provider Note   CSN: [id number] Arrival date & time: 10/04/20  1312     History Chief Complaint  Patient presents with   Finger Injury    Tineshia Becraft is a 25 y.o. female.  Shaneal Jakiyah Stepney is a 25 y.o. female with a history of migraines, asthma, hypertension and diabetes, who presents to the emergency department for evaluation of left hand and wrist injury.  She reports she was trying to help get a dog from behind a car when she lost her balance falling forward and catching her full weight on her left hand and wrist.  She reports since then she has had pain and swelling in the thumb index and middle fingers as well as the radial aspect of the wrist.  She reports some tingling in the fingers.  No cuts or skin changes.  She has not taken anything for pain prior to arrival.  No other pain or injuries from the fall.  No other aggravating or alleviating factors.  The history is provided by the patient.      Past Medical History:  Diagnosis Date   Asthma    Diabetes mellitus without complication (HCC)    Hypertension    Migraines     There are no problems to display for this patient.   Past Surgical History:  Procedure Laterality Date   stenosis       OB History   No obstetric history on file.     History reviewed. No pertinent family history.  Social History   Tobacco Use   Smoking status: Never   Smokeless tobacco: Never  Vaping Use   Vaping Use: Never used  Substance Use Topics   Alcohol use: Never   Drug use: Never    Home Medications Prior to Admission medications   Medication Sig Start Date End Date Taking? Authorizing Provider  naproxen (NAPROSYN) 500 MG tablet Take 1 tablet (500 mg total) by mouth 2 (two) times daily with a meal. 07/19/20   Garlon Hatchet, PA-C    Allergies    Bee venom, Latex, Lithium, and Seroquel [quetiapine]  Review of Systems   Review of Systems   Constitutional:  Negative for chills and fever.  Musculoskeletal:  Positive for arthralgias and joint swelling.  Skin:  Negative for color change, rash and wound.  Neurological:  Negative for weakness and numbness.   Physical Exam Updated Vital Signs BP (!) 156/105   Pulse 85   Temp 98.9 F (37.2 C) (Oral)   Resp 16   Ht 5\' 11"  (1.803 m)   Wt (!) 149.7 kg   SpO2 97%   BMI 46.03 kg/m   Physical Exam Vitals and nursing note reviewed.  Constitutional:      General: She is not in acute distress.    Appearance: Normal appearance. She is well-developed. She is not ill-appearing or diaphoretic.  HENT:     Head: Normocephalic and atraumatic.  Eyes:     General:        Right eye: No discharge.        Left eye: No discharge.  Pulmonary:     Effort: Pulmonary effort is normal. No respiratory distress.  Musculoskeletal:     Comments: There is some slight swelling noted over the first second and third fingers of the left hand and the dorsum of the hand with no overlying skin changes, some paresthesia but normal sensation and strength.  Range of motion somewhat limited  due to pain.  Patient also has tenderness over the radial aspect of the left wrist without obvious deformity.  Radial pulse 2+, no tenderness throughout the forearm.  Neurological:     Mental Status: She is alert and oriented to person, place, and time.     Coordination: Coordination normal.  Psychiatric:        Mood and Affect: Mood normal.        Behavior: Behavior normal.    ED Results / Procedures / Treatments   Labs (all labs ordered are listed, but only abnormal results are displayed) Labs Reviewed - No data to display  EKG None  Radiology DG Wrist Complete Left  Result Date: 10/04/2020 CLINICAL DATA:  Post fall while bending over in a 25 year old female. Pain around base of the thumb and numbness in index finger. EXAM: LEFT HAND - COMPLETE 3+ VIEW; LEFT WRIST - COMPLETE 3+ VIEW COMPARISON:  None FINDINGS:  LEFT wrist: Soft tissue swelling about the LEFT wrist. LEFT hand: No sign of acute fracture or dislocation about the LEFT hand. There is some soft tissue swelling in a generalized fashion. No visible fracture or dislocation. IMPRESSION: No acute fracture or dislocation with generalized swelling. Electronically Signed   By: Donzetta Kohut M.D.   On: 10/04/2020 15:55   DG Hand Complete Left  Result Date: 10/04/2020 CLINICAL DATA:  Post fall while bending over in a 25 year old female. Pain around base of the thumb and numbness in index finger. EXAM: LEFT HAND - COMPLETE 3+ VIEW; LEFT WRIST - COMPLETE 3+ VIEW COMPARISON:  None FINDINGS: LEFT wrist: Soft tissue swelling about the LEFT wrist. LEFT hand: No sign of acute fracture or dislocation about the LEFT hand. There is some soft tissue swelling in a generalized fashion. No visible fracture or dislocation. IMPRESSION: No acute fracture or dislocation with generalized swelling. Electronically Signed   By: Donzetta Kohut M.D.   On: 10/04/2020 15:55    Procedures Procedures   Medications Ordered in ED Medications  ibuprofen (ADVIL) tablet 600 mg (600 mg Oral Given 10/04/20 1529)    ED Course  I have reviewed the triage vital signs and the nursing notes.  Pertinent labs & imaging results that were available during my care of the patient were reviewed by me and considered in my medical decision making (see chart for details).    MDM Rules/Calculators/A&P                           25 year old female presents with pain and swelling to the left hand and wrist after catching herself on the outstretched left hand when she lost her balance.  She has some slight swelling but no obvious deformity and the hand and wrist are neurovascularly intact.  Fortunately x-ray with no evidence of fracture.  Patient does have some snuffbox tenderness on exam, will place in thumb spica splint, encouraged ice, elevation, Tylenol and NSAIDs for pain relief.  We will have  patient follow-up with sports medicine in 1 week if symptoms or not improving.  Return precautions discussed.  Patient expresses understanding and agreement.  Discharged home in good condition.  Final Clinical Impression(s) / ED Diagnoses Final diagnoses:  Injury of left hand, initial encounter  Injury of left wrist, initial encounter    Rx / DC Orders ED Discharge Orders     None        Legrand Rams 10/04/20 2331    Mancel Bale, MD  10/05/20 1024  

## 2020-11-26 ENCOUNTER — Emergency Department (HOSPITAL_COMMUNITY): Payer: Medicaid Other

## 2020-11-26 ENCOUNTER — Other Ambulatory Visit: Payer: Self-pay

## 2020-11-26 ENCOUNTER — Encounter (HOSPITAL_COMMUNITY): Payer: Self-pay | Admitting: Emergency Medicine

## 2020-11-26 ENCOUNTER — Emergency Department (HOSPITAL_COMMUNITY)
Admission: EM | Admit: 2020-11-26 | Discharge: 2020-11-27 | Disposition: A | Discharge status: Home / Self Care | Payer: Medicaid Other | Attending: Emergency Medicine | Admitting: Emergency Medicine

## 2020-11-26 DIAGNOSIS — N9489 Other specified conditions associated with female genital organs and menstrual cycle: Secondary | ICD-10-CM | POA: Insufficient documentation

## 2020-11-26 DIAGNOSIS — J45909 Unspecified asthma, uncomplicated: Secondary | ICD-10-CM | POA: Insufficient documentation

## 2020-11-26 DIAGNOSIS — E119 Type 2 diabetes mellitus without complications: Secondary | ICD-10-CM | POA: Insufficient documentation

## 2020-11-26 DIAGNOSIS — J069 Acute upper respiratory infection, unspecified: Secondary | ICD-10-CM | POA: Diagnosis not present

## 2020-11-26 DIAGNOSIS — R0602 Shortness of breath: Secondary | ICD-10-CM | POA: Diagnosis present

## 2020-11-26 DIAGNOSIS — Z20822 Contact with and (suspected) exposure to covid-19: Secondary | ICD-10-CM | POA: Diagnosis not present

## 2020-11-26 DIAGNOSIS — I10 Essential (primary) hypertension: Secondary | ICD-10-CM | POA: Insufficient documentation

## 2020-11-26 LAB — CBC WITH DIFFERENTIAL/PLATELET
Abs Immature Granulocytes: 0.04 10*3/uL (ref 0.00–0.07)
Basophils Absolute: 0.1 10*3/uL (ref 0.0–0.1)
Basophils Relative: 1 %
Eosinophils Absolute: 0.1 10*3/uL (ref 0.0–0.5)
Eosinophils Relative: 1 %
HCT: 42.2 % (ref 36.0–46.0)
Hemoglobin: 14.3 g/dL (ref 12.0–15.0)
Immature Granulocytes: 0 %
Lymphocytes Relative: 23 %
Lymphs Abs: 2.2 10*3/uL (ref 0.7–4.0)
MCH: 31.7 pg (ref 26.0–34.0)
MCHC: 33.9 g/dL (ref 30.0–36.0)
MCV: 93.6 fL (ref 80.0–100.0)
Monocytes Absolute: 0.7 10*3/uL (ref 0.1–1.0)
Monocytes Relative: 7 %
Neutro Abs: 6.4 10*3/uL (ref 1.7–7.7)
Neutrophils Relative %: 68 %
Platelets: 371 10*3/uL (ref 150–400)
RBC: 4.51 MIL/uL (ref 3.87–5.11)
RDW: 11.6 % (ref 11.5–15.5)
WBC: 9.5 10*3/uL (ref 4.0–10.5)
nRBC: 0 % (ref 0.0–0.2)

## 2020-11-26 LAB — I-STAT BETA HCG BLOOD, ED (MC, WL, AP ONLY): I-stat hCG, quantitative: <5 m[IU]/mL (ref <5)

## 2020-11-26 NOTE — ED Triage Notes (Signed)
Pt reports she woke up w/ sore throat and SOB.  Denies fevers, N/V.

## 2020-11-26 NOTE — ED Provider Notes (Signed)
Emergency Medicine Provider Triage Evaluation Note  Susan Butler , a 25 y.o. female  was evaluated in triage.  Pt complains of sore throat, shortness of breath, and nonproductive cough.  Symptoms started today.  Patient denies any known sick contacts.  Review of Systems  Positive: Shortness of breath, cough, sore throat Negative: Trouble swallowing, drooling, high potato voice, fever, chills, chest pain, palpitations, leg swelling or tenderness  Physical Exam  BP (!) 148/91 (BP Location: Left Arm)   Pulse (!) 116   Temp 98.7 F (37.1 C)   Resp (!) 22   SpO2 98%  Gen:   Awake, no distress   Resp:  Normal effort, lungs clear to auscultation bilaterally.  Patient speaks in full clear senses without difficulty. MSK:   Moves extremities without difficulty; no swelling or tenderness to bilateral lower extremities Other:  No trismus.  No swelling, erythema, or exudate noted to oropharynx or tonsils bilaterally.  Patient able to handle oral secretions without difficulty.  Medical Decision Making  Medically screening exam initiated at 11:42 PM.  Appropriate orders placed.  Susan Butler was informed that the remainder of the evaluation will be completed by another provider, this initial triage assessment does not replace that evaluation, and the importance of remaining in the ED until their evaluation is complete.     Haskel Schroeder, PA-C 11/26/20 2344    Geoffery Lyons, MD 11/27/20 [phone]

## 2020-11-27 LAB — BASIC METABOLIC PANEL
Anion gap: 8 (ref 5–15)
BUN: 10 mg/dL (ref 6–20)
CO2: 26 mmol/L (ref 22–32)
Calcium: 9.2 mg/dL (ref 8.9–10.3)
Chloride: 104 mmol/L (ref 98–111)
Creatinine, Ser: 0.83 mg/dL (ref 0.44–1.00)
GFR, Estimated: >60 mL/min (ref >60)
Glucose, Bld: 96 mg/dL (ref 70–99)
Potassium: 4 mmol/L (ref 3.5–5.1)
Sodium: 138 mmol/L (ref 135–145)

## 2020-11-27 LAB — RESP PANEL BY RT-PCR (FLU A&B, COVID) ARPGX2
Influenza A by PCR: NEGATIVE
Influenza B by PCR: NEGATIVE
SARS Coronavirus 2 by RT PCR: NEGATIVE

## 2020-11-27 LAB — D-DIMER, QUANTITATIVE: D-Dimer, Quant: 0.3 ug/mL-FEU (ref 0.00–0.50)

## 2020-11-27 MED ORDER — BENZONATATE 100 MG PO CAPS: 100.0000 mg | ORAL_CAPSULE | Freq: Three times a day (TID) | ORAL | 0 refills | Status: AC

## 2020-11-27 NOTE — Discharge Instructions (Addendum)
Your blood work and chest x-ray look great today.  Your COVID and flu swab will not come back while you are in the emergency department.  You will see these results online or you can expect a call if they are positive.  I have sent cough medication to a pharmacy.  Please take this as prescribed.  It was a pleasure to meet you and I hope that you feel better.

## 2020-11-27 NOTE — ED Provider Notes (Signed)
[facility] EMERGENCY DEPARTMENT Provider Note   CSN: [id number] Arrival date & time: 11/26/20  2258     History Chief Complaint  Patient presents with   Shortness of Breath   Sore Throat    Shaely Gadberry is a 25 y.o. female with a past medical history of asthma, diabetes and hypertension presenting today with a complaint of sore throat and cough that began when she woke up this morning.  She reports that the cough is productive of mucus-like sputum.  No blood.  Patient denies shortness of breath or chest pain.  She has not been needing an albuterol inhaler, but she is concerned because she has had a lot of episodes of pneumonia in the past.  Sore throat is worse when she is swallowing however it is not keeping her from eating or drinking.  She denies any recent travel, OCP use, leg swelling or previous DVT.  Has taken 2 negative at home COVID test.  No known sick contacts.   Past Medical History:  Diagnosis Date   Asthma    Diabetes mellitus without complication (HCC)    Hypertension    Migraines     There are no problems to display for this patient.   Past Surgical History:  Procedure Laterality Date   stenosis       OB History   No obstetric history on file.     No family history on file.  Social History   Tobacco Use   Smoking status: Never   Smokeless tobacco: Never  Vaping Use   Vaping Use: Never used  Substance Use Topics   Alcohol use: Never   Drug use: Never    Home Medications Prior to Admission medications   Medication Sig Start Date End Date Taking? Authorizing Provider  naproxen (NAPROSYN) 500 MG tablet Take 1 tablet (500 mg total) by mouth 2 (two) times daily with a meal. 07/19/20   Garlon Hatchet, PA-C    Allergies    Bee venom, Latex, Lithium, and Seroquel [quetiapine]  Review of Systems   Review of Systems  Constitutional:  Negative for chills and fever.  HENT:  Positive for sore throat. Negative for ear  pain.   Eyes:  Negative for pain and visual disturbance.  Respiratory:  Positive for cough. Negative for shortness of breath.   Cardiovascular:  Negative for chest pain, palpitations and leg swelling.  Gastrointestinal:  Positive for nausea. Negative for abdominal pain and vomiting.  Musculoskeletal:  Negative for myalgias.  Skin:  Negative for color change and rash.  Neurological:  Negative for dizziness.  All other systems reviewed and are negative.  Physical Exam Updated Vital Signs BP 109/69 (BP Location: Right Wrist)   Pulse 90   Temp 98.7 F (37.1 C)   Resp 15   SpO2 98%   Physical Exam Vitals and nursing note reviewed.  Constitutional:      Appearance: Normal appearance.  HENT:     Head: Normocephalic and atraumatic.     Mouth/Throat:     Mouth: Mucous membranes are moist.     Pharynx: Oropharynx is clear.  Eyes:     General: No scleral icterus.    Conjunctiva/sclera: Conjunctivae normal.     Pupils: Pupils are equal, round, and reactive to light.  Cardiovascular:     Rate and Rhythm: Normal rate and regular rhythm.  Pulmonary:     Effort: Pulmonary effort is normal. No respiratory distress.     Breath sounds: No  wheezing, rhonchi or rales.  Chest:     Chest wall: No tenderness.  Abdominal:     Palpations: Abdomen is soft.     Tenderness: There is no abdominal tenderness.  Musculoskeletal:     Cervical back: Normal range of motion.     Right lower leg: No edema.     Left lower leg: No edema.  Lymphadenopathy:     Cervical: No cervical adenopathy.  Skin:    General: Skin is warm.     Findings: No rash.  Neurological:     Mental Status: She is alert.  Psychiatric:        Mood and Affect: Mood normal.        Behavior: Behavior normal.    ED Results / Procedures / Treatments   Labs (all labs ordered are listed, but only abnormal results are displayed) Labs Reviewed  RESP PANEL BY RT-PCR (FLU A&B, COVID) ARPGX2  BASIC METABOLIC PANEL  CBC WITH  DIFFERENTIAL/PLATELET  D-DIMER, QUANTITATIVE  I-STAT BETA HCG BLOOD, ED ([facility], [facility], [facility] ONLY)    EKG None  Radiology DG Chest 2 View  Result Date: 11/27/2020 CLINICAL DATA:  Cough and shortness of breath EXAM: CHEST - 2 VIEW COMPARISON:  None FINDINGS: The heart and mediastinal contours are within normal limits. No focal consolidation. No pulmonary edema. No pleural effusion. No pneumothorax. No acute osseous abnormality. IMPRESSION: No active cardiopulmonary disease. Electronically Signed   By: Tish Frederickson M.D.   On: 11/27/2020 00:01    Procedures Procedures   Medications Ordered in ED Medications - No data to display  ED Course  I have reviewed the triage vital signs and the nursing notes.  Pertinent labs & imaging results that were available during my care of the patient were reviewed by me and considered in my medical decision making (see chart for details).    MDM Rules/Calculators/A&P  I evaluated the patient.  She was stable and in no acute distress.  She was tolerating her secretions and she had no increased work of breathing.  I suspect she is suffering from some viral illness, possibly COVID-19 or the flu.  Her chest x-ray was normal and she did not have a leukocytosis.  All other blood work also negative.  I will discharge her with Tessalon Perles and outstanding COVID/flu testing.  She will look for these results in her chart or receive a phone call for a positive result.  Return precautions were discussed and they are also attached to her discharge papers.  Final Clinical Impression(s) / ED Diagnoses Final diagnoses:  Viral upper respiratory tract infection    Rx / DC Orders Results and diagnoses were explained to the patient. Return precautions discussed in full. Patient had no additional questions and expressed complete understanding.     Woodroe Chen 11/27/20 7741    Cheryll Cockayne, MD 11/27/20 [phone]

## 2021-03-12 ENCOUNTER — Other Ambulatory Visit: Payer: Self-pay | Admitting: Family Medicine

## 2021-03-12 ENCOUNTER — Ambulatory Visit
Admission: RE | Admit: 2021-03-12 | Discharge: 2021-03-12 | Disposition: A | Discharge status: Home / Self Care | Payer: Medicaid Other | Source: Ambulatory Visit | Attending: Family Medicine | Admitting: Family Medicine

## 2021-03-12 DIAGNOSIS — M545 Low back pain, unspecified: Secondary | ICD-10-CM

## 2021-05-24 ENCOUNTER — Encounter (HOSPITAL_COMMUNITY): Payer: Self-pay | Admitting: Emergency Medicine

## 2021-05-24 ENCOUNTER — Emergency Department (HOSPITAL_COMMUNITY)
Admission: EM | Admit: 2021-05-24 | Discharge: 2021-05-25 | Disposition: A | Discharge status: Home / Self Care | Payer: Medicaid Other | Attending: Emergency Medicine | Admitting: Emergency Medicine

## 2021-05-24 ENCOUNTER — Other Ambulatory Visit: Payer: Self-pay

## 2021-05-24 ENCOUNTER — Emergency Department (HOSPITAL_COMMUNITY): Payer: Medicaid Other

## 2021-05-24 DIAGNOSIS — Z91040 Latex allergy status: Secondary | ICD-10-CM | POA: Diagnosis not present

## 2021-05-24 DIAGNOSIS — Z20822 Contact with and (suspected) exposure to covid-19: Secondary | ICD-10-CM | POA: Insufficient documentation

## 2021-05-24 DIAGNOSIS — J40 Bronchitis, not specified as acute or chronic: Secondary | ICD-10-CM | POA: Diagnosis not present

## 2021-05-24 DIAGNOSIS — R059 Cough, unspecified: Secondary | ICD-10-CM | POA: Diagnosis present

## 2021-05-24 LAB — URINALYSIS, ROUTINE W REFLEX MICROSCOPIC
Bilirubin Urine: NEGATIVE
Glucose, UA: NEGATIVE mg/dL
Hgb urine dipstick: NEGATIVE
Ketones, ur: NEGATIVE mg/dL
Nitrite: NEGATIVE
Protein, ur: NEGATIVE mg/dL
Specific Gravity, Urine: 1.025 (ref 1.005–1.030)
pH: 5 (ref 5.0–8.0)

## 2021-05-24 LAB — CBC WITH DIFFERENTIAL/PLATELET
Abs Immature Granulocytes: 0.05 10*3/uL (ref 0.00–0.07)
Basophils Absolute: 0.1 10*3/uL (ref 0.0–0.1)
Basophils Relative: 1 %
Eosinophils Absolute: 0.2 10*3/uL (ref 0.0–0.5)
Eosinophils Relative: 2 %
HCT: 39.7 % (ref 36.0–46.0)
Hemoglobin: 13.5 g/dL (ref 12.0–15.0)
Immature Granulocytes: 1 %
Lymphocytes Relative: 24 %
Lymphs Abs: 2.2 10*3/uL (ref 0.7–4.0)
MCH: 32.4 pg (ref 26.0–34.0)
MCHC: 34 g/dL (ref 30.0–36.0)
MCV: 95.2 fL (ref 80.0–100.0)
Monocytes Absolute: 0.9 10*3/uL (ref 0.1–1.0)
Monocytes Relative: 10 %
Neutro Abs: 5.6 10*3/uL (ref 1.7–7.7)
Neutrophils Relative %: 62 %
Platelets: 367 10*3/uL (ref 150–400)
RBC: 4.17 MIL/uL (ref 3.87–5.11)
RDW: 11.9 % (ref 11.5–15.5)
WBC: 9 10*3/uL (ref 4.0–10.5)
nRBC: 0 % (ref 0.0–0.2)

## 2021-05-24 LAB — BASIC METABOLIC PANEL
Anion gap: 6 (ref 5–15)
BUN: 10 mg/dL (ref 6–20)
CO2: 25 mmol/L (ref 22–32)
Calcium: 9.5 mg/dL (ref 8.9–10.3)
Chloride: 108 mmol/L (ref 98–111)
Creatinine, Ser: 0.89 mg/dL (ref 0.44–1.00)
GFR, Estimated: >60 mL/min (ref >60)
Glucose, Bld: 88 mg/dL (ref 70–99)
Potassium: 4.1 mmol/L (ref 3.5–5.1)
Sodium: 139 mmol/L (ref 135–145)

## 2021-05-24 LAB — RESP PANEL BY RT-PCR (FLU A&B, COVID) ARPGX2
Influenza A by PCR: NEGATIVE
Influenza B by PCR: NEGATIVE
SARS Coronavirus 2 by RT PCR: NEGATIVE

## 2021-05-24 MED ORDER — DOXYCYCLINE HYCLATE 100 MG PO CAPS: 100.0000 mg | ORAL_CAPSULE | Freq: Two times a day (BID) | ORAL | 0 refills | Status: DC

## 2021-05-24 NOTE — ED Triage Notes (Signed)
Pt c/o cough, congestion, and sore throat since yesterday.  ?

## 2021-05-24 NOTE — ED Provider Notes (Signed)
?[facility] EMERGENCY DEPARTMENT ?Provider Note ? ? ?CSN: [id number] ?Arrival date & time: 05/24/21  1857 ? ?  ? ?History ? ?Chief Complaint  ?Patient presents with  ? Cough  ? ? ?Susan Butler is a 26 y.o. female. ? ?HPI ?Patient is presenting for evaluation of cough productive of green to yellow sputum.  She has mild shortness of breath.  She denies fever, chills, vomiting or dizziness.  She is not a cigarette smoker. ?  ? ?Home Medications ?Prior to Admission medications   ?Medication Sig Start Date End Date Taking? Authorizing Provider  ?doxycycline (VIBRAMYCIN) 100 MG capsule Take 1 capsule (100 mg total) by mouth 2 (two) times daily. One po bid x 7 days 05/24/21  Yes Mancel Bale, MD  ?benzonatate (TESSALON) 100 MG capsule Take 1 capsule (100 mg total) by mouth every 8 (eight) hours. 11/27/20   Redwine, Madison A, PA-C  ?naproxen (NAPROSYN) 500 MG tablet Take 1 tablet (500 mg total) by mouth 2 (two) times daily with a meal. 07/19/20   Garlon Hatchet, PA-C  ?   ? ?Allergies    ?Bee venom, Latex, Lithium, and Seroquel [quetiapine]   ? ?Review of Systems   ?Review of Systems ? ?Physical Exam ?Updated Vital Signs ?BP 132/84   Pulse 85   Temp 97.9 ?F (36.6 ?C) (Oral)   Resp 18   Ht 5\' 11"  (1.803 m)   Wt (!) 149 kg   SpO2 99%   BMI 45.81 kg/m?  ?Physical Exam ?Vitals and nursing note reviewed.  ?Constitutional:   ?   General: She is not in acute distress. ?   Appearance: She is well-developed. She is obese. She is not ill-appearing or diaphoretic.  ?HENT:  ?   Head: Normocephalic and atraumatic.  ?   Right Ear: External ear normal.  ?   Left Ear: External ear normal.  ?Eyes:  ?   Conjunctiva/sclera: Conjunctivae normal.  ?   Pupils: Pupils are equal, round, and reactive to light.  ?Neck:  ?   Trachea: Phonation normal.  ?Cardiovascular:  ?   Rate and Rhythm: Normal rate and regular rhythm.  ?   Heart sounds: Normal heart sounds.  ?Pulmonary:  ?   Effort: Pulmonary effort is normal.  No respiratory distress.  ?   Breath sounds: Normal breath sounds. No stridor. No rhonchi.  ?Abdominal:  ?   Palpations: Abdomen is soft.  ?   Tenderness: There is no abdominal tenderness.  ?Musculoskeletal:     ?   General: Normal range of motion.  ?   Cervical back: Normal range of motion and neck supple.  ?Skin: ?   General: Skin is warm and dry.  ?Neurological:  ?   Mental Status: She is alert and oriented to person, place, and time.  ?   Cranial Nerves: No cranial nerve deficit.  ?   Sensory: No sensory deficit.  ?   Motor: No abnormal muscle tone.  ?   Coordination: Coordination normal.  ?Psychiatric:     ?   Mood and Affect: Mood normal.     ?   Behavior: Behavior normal.     ?   Thought Content: Thought content normal.     ?   Judgment: Judgment normal.  ? ? ?ED Results / Procedures / Treatments   ?Labs ?(all labs ordered are listed, but only abnormal results are displayed) ?Labs Reviewed  ?URINALYSIS, ROUTINE W REFLEX MICROSCOPIC - Abnormal; Notable for the following components:  ?  Result Value  ? APPearance HAZY (*)   ? Leukocytes,Ua MODERATE (*)   ? Bacteria, UA RARE (*)   ? All other components within normal limits  ?RESP PANEL BY RT-PCR (FLU A&B, COVID) ARPGX2  ?BASIC METABOLIC PANEL  ?CBC WITH DIFFERENTIAL/PLATELET  ? ? ?EKG ?None ? ?Radiology ?DG Chest 1 View ? ?Result Date: 05/24/2021 ?CLINICAL DATA:  Shortness of breath. EXAM: CHEST  1 VIEW COMPARISON:  Chest x-ray 11/26/2020 FINDINGS: The heart size and mediastinal contours are within normal limits. Both lungs are clear. The visualized skeletal structures are unremarkable. IMPRESSION: No active disease. Electronically Signed   By: Darliss Cheney M.D.   On: 05/24/2021 21:32   ? ?Procedures ?Procedures  ? ? ?Medications Ordered in ED ?Medications - No data to display ? ?ED Course/ Medical Decision Making/ A&P ?  ?                        ?Medical Decision Making ?Cough or Delfavero of sputum and non-smoker.  Nontoxic appearance with normal vital  signs.  Suspect viral illness.  Cannot rule out bacterial bronchitis ? ?Problems Addressed: ?Bronchitis: acute illness or injury ?   Details: Short-term illness without worrisome findings. ? ?Amount and/or Complexity of Data Reviewed ?Independent Historian:  ?   Details: She is a cogent historian ?Labs: ordered. ?   Details: CBC, metabolic panel, viral panel, urinalysis-all normal ?Radiology: ordered and independent interpretation performed. ?   Details: No infiltrate or edema ? ?Risk ?Prescription drug management. ?Decision regarding hospitalization. ?Risk Details: Overweight female with cough productive of discolored sputum.  No wheezing, no pneumonia on imaging.  Screening labs are reassuring.  She is stable for discharge with outpatient management.  She requested a prescription to use, if her symptoms do not improve within the next few days and was therefore given 1.  She does not require hospitalization at this time. ? ? ? ? ? ? ? ? ? ? ?Final Clinical Impression(s) / ED Diagnoses ?Final diagnoses:  ?Bronchitis  ? ? ?Rx / DC Orders ?ED Discharge Orders   ? ?      Ordered  ?  doxycycline (VIBRAMYCIN) 100 MG capsule  2 times daily       ? 05/24/21 2353  ? ?  ?  ? ?  ? ? ?  ?Mancel Bale, MD ?05/26/21 1316 ? ?

## 2021-05-24 NOTE — ED Provider Triage Note (Signed)
Emergency Medicine Provider Triage Evaluation Note ? ?Susan Butler , a 26 y.o. female  was evaluated in triage.  Pt complains of cough, sore throat, congestion since yesterday.  Patient denies sick contacts.  Denies abdominal pain, denies chest pain.  Patient states that she has history of multiple pneumonia diagnoses with 6 pneumonia diagnoses last year ? ?Review of Systems  ?Positive: Shortness of breath, cough, sore throat, congestion ?Negative: Chest pain, abdominal pain ? ?Physical Exam  ?BP (!) 143/74 (BP Location: Right Wrist)   Pulse (!) 121   Temp 99 ?F (37.2 ?C) (Oral)   Resp 20   Ht 5\' 11"  (1.803 m)   Wt (!) 149 kg   SpO2 99%   BMI 45.81 kg/m?  ?Gen:   Awake, no distress   ?Resp:  Normal effort  ?MSK:   Moves extremities without difficulty  ?Other:   ? ?Medical Decision Making  ?Medically screening exam initiated at 8:03 PM.  Appropriate orders placed.  Susan Butler was informed that the remainder of the evaluation will be completed by another provider, this initial triage assessment does not replace that evaluation, and the importance of remaining in the ED until their evaluation is complete. ? ? ?  ? , PA-C ?05/24/21 2004 ? ?

## 2021-05-24 NOTE — Discharge Instructions (Addendum)
There are no signs of pneumonia or viral infections like COVID or flu.  You appear to have bronchitis.  To treat this, make sure you are getting plenty of rest and drink a lot of fluids and gradually increase your activity.  We are giving you a prescription antibiotic to take if you feel like you are not getting better after another 5 days.  Or if you have increased sputum production or fever.  Follow-up with your doctor if not better in 1 or 2 weeks ?

## 2021-10-01 IMAGING — CR DG WRIST COMPLETE 3+V*L*
4 series · 4 of 4 positions shown · non-contrast
Comparison: None

CLINICAL DATA: Post fall while bending over in a 25-year-old
female. Pain around base of the thumb and numbness in index finger.

EXAM:
LEFT HAND - COMPLETE 3+ VIEW; LEFT WRIST - COMPLETE 3+ VIEW

[wrist pa]
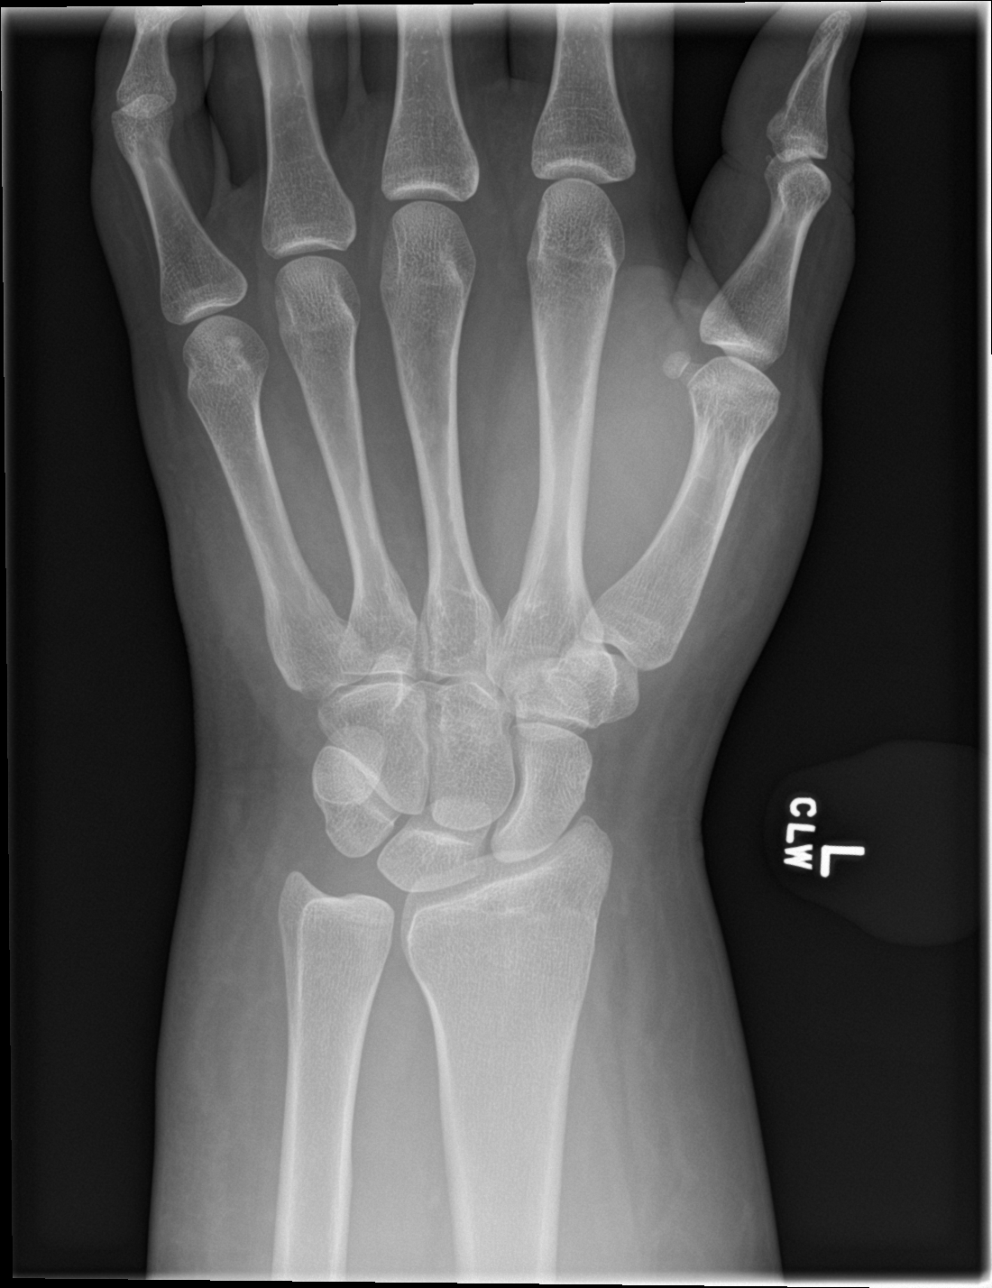

[wrist obl]
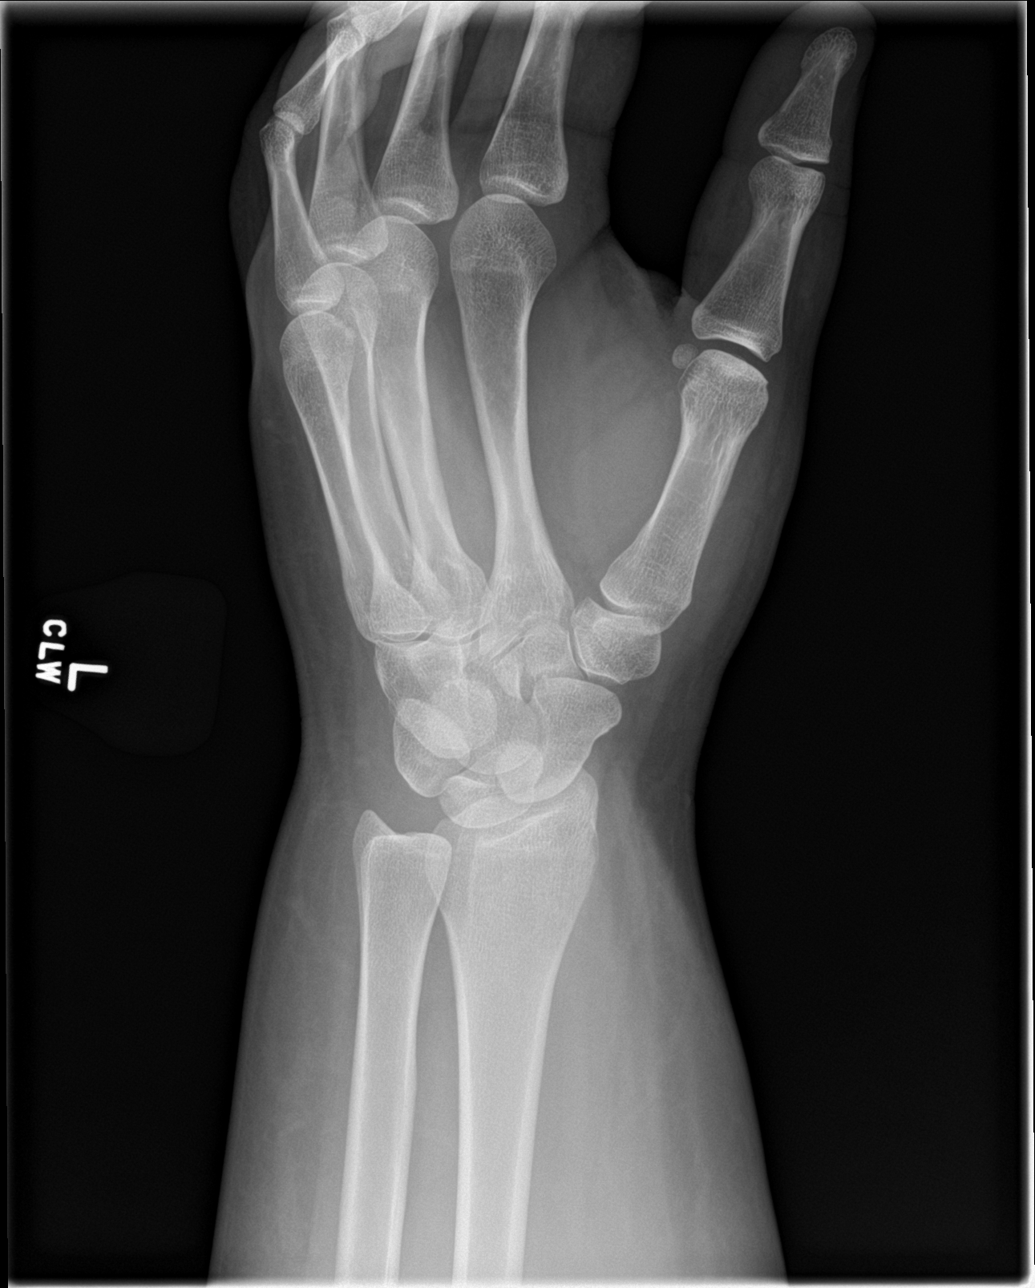

[wrist lat]
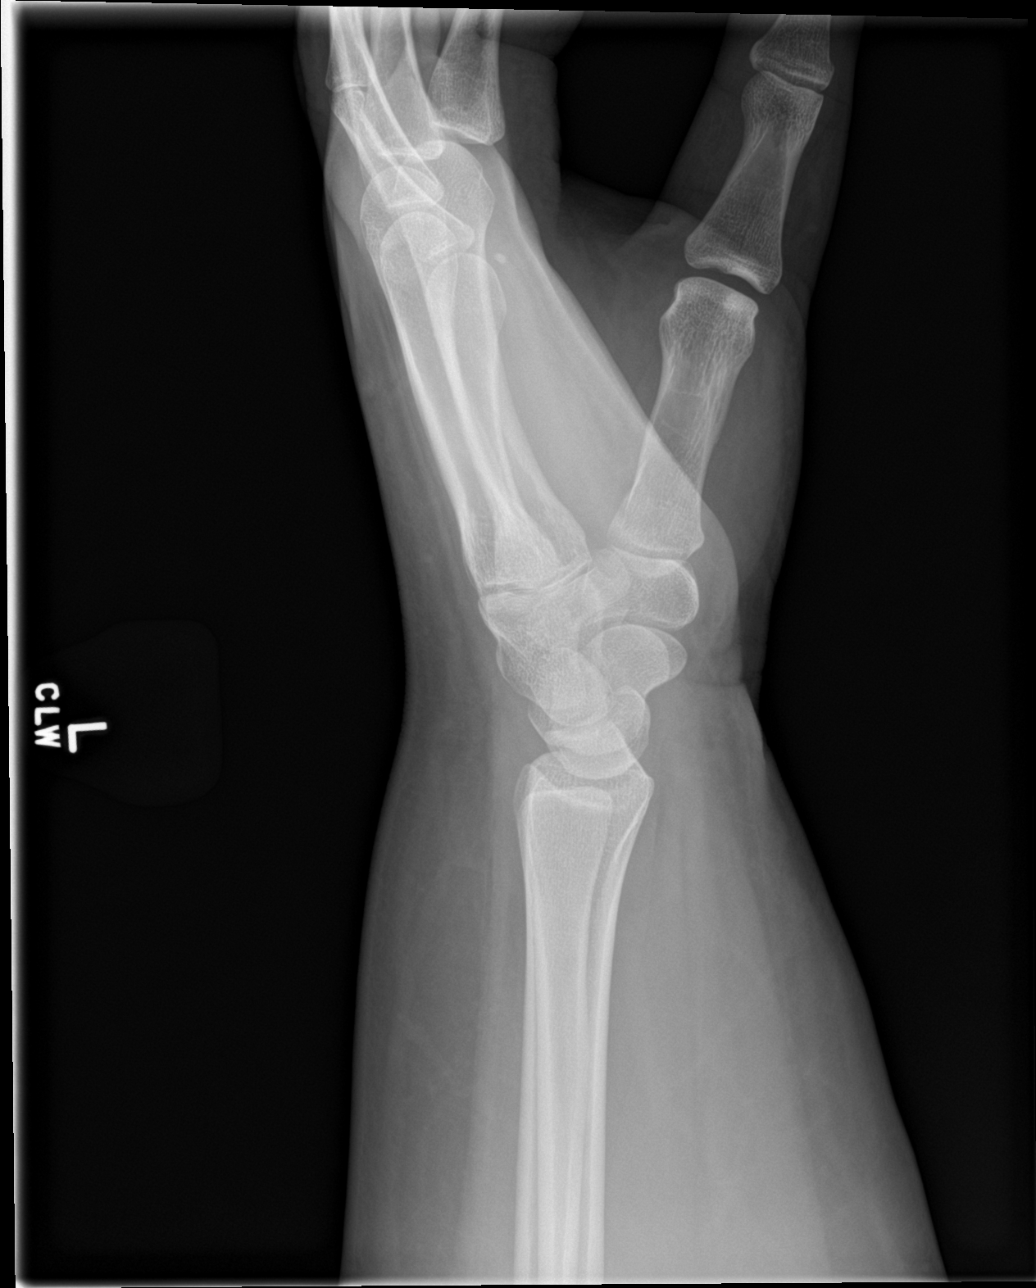

[wrist navicular]
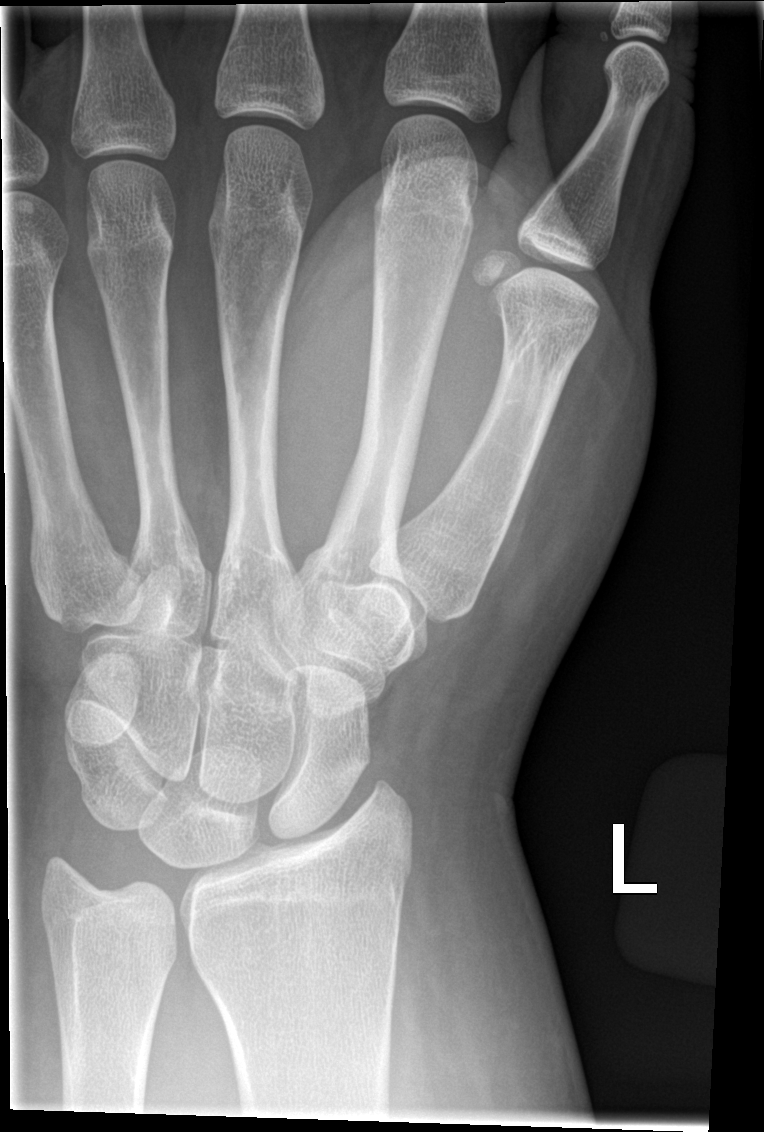

[4 of 4 positions shown; findings below may reference images not displayed]

FINDINGS: LEFT wrist: Soft tissue swelling about the LEFT wrist.

LEFT hand: No sign of acute fracture or dislocation about the LEFT
hand. There is some soft tissue swelling in a generalized fashion.
No visible fracture or dislocation.
IMPRESSION: No acute fracture or dislocation with generalized swelling.

## 2021-11-06 ENCOUNTER — Ambulatory Visit (INDEPENDENT_AMBULATORY_CARE_PROVIDER_SITE_OTHER): Payer: Medicare HMO

## 2021-11-06 ENCOUNTER — Encounter (HOSPITAL_COMMUNITY): Payer: Self-pay | Admitting: Emergency Medicine

## 2021-11-06 ENCOUNTER — Ambulatory Visit (HOSPITAL_COMMUNITY)
Admission: EM | Admit: 2021-11-06 | Discharge: 2021-11-06 | Disposition: A | Discharge status: Home / Self Care | Payer: Medicare HMO

## 2021-11-06 DIAGNOSIS — W19XXXA Unspecified fall, initial encounter: Secondary | ICD-10-CM

## 2021-11-06 DIAGNOSIS — S63502A Unspecified sprain of left wrist, initial encounter: Secondary | ICD-10-CM | POA: Diagnosis not present

## 2021-11-06 DIAGNOSIS — M25532 Pain in left wrist: Secondary | ICD-10-CM | POA: Diagnosis not present

## 2021-11-06 DIAGNOSIS — M79642 Pain in left hand: Secondary | ICD-10-CM | POA: Diagnosis not present

## 2021-11-06 NOTE — ED Triage Notes (Signed)
Pt reports walking dog and got tripped up by him 3 days ago falling on stairs. C/o left wrist pain and pain and left thumb that still has some swelling. Has bruise on right forearm and chin. Reports little neck pains when turns head.

## 2021-11-06 NOTE — Discharge Instructions (Addendum)
X-ray was negative and showed no fracture.  We are giving you a brace today to help stabilize the wrist.  You may still have some inflammation due to the impact of the fall.  You may also ice the area to help with inflammation.  If things do not improve within the next week, he may follow-up with [facility] I have placed that information on to your discharge paperwork.  I have attached some information about rest, ice, compression, and elevation to the back of your paperwork.

## 2021-11-06 NOTE — ED Provider Notes (Signed)
[facility]    CSN: [id number] Arrival date & time: 11/06/21  1651      History   Chief Complaint Chief Complaint  Patient presents with   Fall   Wrist Pain    HPI Susan Butler is a 26 y.o. female.  Patient complaining of left wrist and left hand pain that started 3 days ago after a fall.  Patient states that fall occurred while outside and tripping over her puppy.  Patient denies any loss of consciousness or hitting head during fall.  Patient reports bruising and swelling to left hand upon onset of symptoms.  Patient has abrasion from fall present to right forearm and chin that is not causing any discomfort currently.  Patient has not taken any medications for symptoms.   Fall  Wrist Pain    Past Medical History:  Diagnosis Date   Asthma    Diabetes mellitus without complication ([place])    Hypertension    Migraines     There are no problems to display for this patient.   Past Surgical History:  Procedure Laterality Date   stenosis      OB History   No obstetric history on file.      Home Medications    Prior to Admission medications   Medication Sig Start Date End Date Taking? Authorizing Provider  albuterol (VENTOLIN HFA) 108 (90 Base) MCG/ACT inhaler Inhale 2 puffs into the lungs as needed. 10/04/21   [provider]  benzonatate (TESSALON) 100 MG capsule Take 1 capsule (100 mg total) by mouth every 8 (eight) hours. 11/27/20   Redwine, Madison A, PA-C  doxycycline (VIBRAMYCIN) 100 MG capsule Take 1 capsule (100 mg total) by mouth 2 (two) times daily. One po bid x 7 days 05/24/21   Daleen Bo, MD  fluticasone-salmeterol (ADVAIR DISKUS) 500-50 MCG/ACT AEPB Inhale 1 puff into the lungs in the morning and at bedtime.    [provider]  naproxen (NAPROSYN) 500 MG tablet Take 1 tablet (500 mg total) by mouth 2 (two) times daily with a meal. 07/19/20   Larene Pickett, PA-C    Family History No family history on  file.  Social History Social History   Tobacco Use   Smoking status: Never   Smokeless tobacco: Never  Vaping Use   Vaping Use: Never used  Substance Use Topics   Alcohol use: Never   Drug use: Never     Allergies   Bee venom, Latex, Lithium, and Seroquel [quetiapine]   Review of Systems Review of Systems  Constitutional:  Negative for activity change.  Musculoskeletal:  Positive for arthralgias (Pain present to LFT wrist.) and joint swelling.  Neurological:  Negative for numbness.     Physical Exam Triage Vital Signs ED Triage Vitals  Enc Vitals Group     BP 11/06/21 1729 (!) 141/99     Pulse Rate 11/06/21 1729 100     Resp 11/06/21 1729 20     Temp 11/06/21 1729 98.1 F (36.7 C)     Temp Source 11/06/21 1729 Oral     SpO2 11/06/21 1729 100 %     Weight --      Height --      Head Circumference --      Peak Flow --      Pain Score 11/06/21 1725 4     Pain Loc --      Pain Edu? --      Excl. in [place]? --  No data found.  Updated Vital Signs BP (!) 141/99 (BP Location: Right Arm)   Pulse 100   Temp 98.1 F (36.7 C) (Oral)   Resp 20   LMP 09/15/2021   SpO2 100%   Visual Acuity Right Eye Distance:   Left Eye Distance:   Bilateral Distance:    Right Eye Near:   Left Eye Near:    Bilateral Near:     Physical Exam Vitals and nursing note reviewed.  Constitutional:      Appearance: Normal appearance.  Musculoskeletal:     Right wrist: Normal.     Left wrist: Swelling, bony tenderness and snuff box tenderness present. Decreased range of motion. Normal pulse.     Right hand: Normal.     Left hand: Swelling (Medial dorsal side of hand) and tenderness present. Normal sensation. Normal capillary refill. Normal pulse.       Arms:  Skin:    Capillary Refill: Capillary refill takes less than 2 seconds. Normal cap refill in LFT hand and RT hand  Neurological:     Mental Status: She is alert.      UC Treatments / Results  Labs (all labs ordered  are listed, but only abnormal results are displayed) Labs Reviewed - No data to display  EKG   Radiology DG Wrist Complete Left  Result Date: 11/06/2021 CLINICAL DATA:  Fall. EXAM: LEFT WRIST - COMPLETE 3+ VIEW COMPARISON:  None Available. FINDINGS: There is no evidence of fracture or dislocation. There is no evidence of arthropathy or other focal bone abnormality. Soft tissues are unremarkable. IMPRESSION: Negative. Electronically Signed   By: Darliss Cheney M.D.   On: 11/06/2021 18:31   DG Hand 2 View Left  Result Date: 11/06/2021 CLINICAL DATA:  Fall, pain along the medial hand EXAM: LEFT HAND - 2 VIEW COMPARISON:  None Available. FINDINGS: There is no evidence of fracture or dislocation. There is no evidence of arthropathy or other focal bone abnormality. Soft tissues are unremarkable. IMPRESSION: Negative. Electronically Signed   By: Gaylyn Rong M.D.   On: 11/06/2021 18:26    Procedures Procedures (including critical care time)  Medications Ordered in UC Medications - No data to display  Initial Impression / Assessment and Plan / UC Course  I have reviewed the triage vital signs and the nursing notes.  Pertinent labs & imaging results that were available during my care of the patient were reviewed by me and considered in my medical decision making (see chart for details).  Patient was treated for sprain of left wrist due to a fall.  Patient had left hand and left wrist x-ray performed while in office.  Patient's x-ray showed no fracture in LFT hand or wrist.  Patient was given a brace today to help compress the left wrist and aid with healing.  Patient was given information on RICE protocol.  She was given information for [facility] if symptoms do not improve within a week.  Patient verbalized understanding of instructions.   Final Clinical Impressions(s) / UC Diagnoses   Final diagnoses:  Fall, initial encounter  Sprain of left wrist, initial encounter     Discharge  Instructions      X-ray was negative and showed no fracture.  We are giving you a brace today to help stabilize the wrist.  You may still have some inflammation due to the impact of the fall.  You may also ice the area to help with inflammation.  If things do not improve within  the next week, he may follow-up with [facility] I have placed that information on to your discharge paperwork.  I have attached some information about rest, ice, compression, and elevation to the back of your paperwork.      ED Prescriptions   None    PDMP not reviewed this encounter.   Debby Freiberg, NP 11/06/21 1904

## 2022-05-21 IMAGING — DX DG CHEST 1V
1 series · 1 of 1 positions shown · non-contrast
Comparison: Chest x-ray 11/26/2020

CLINICAL DATA: Shortness of breath.

EXAM:
CHEST  1 VIEW

[chest]
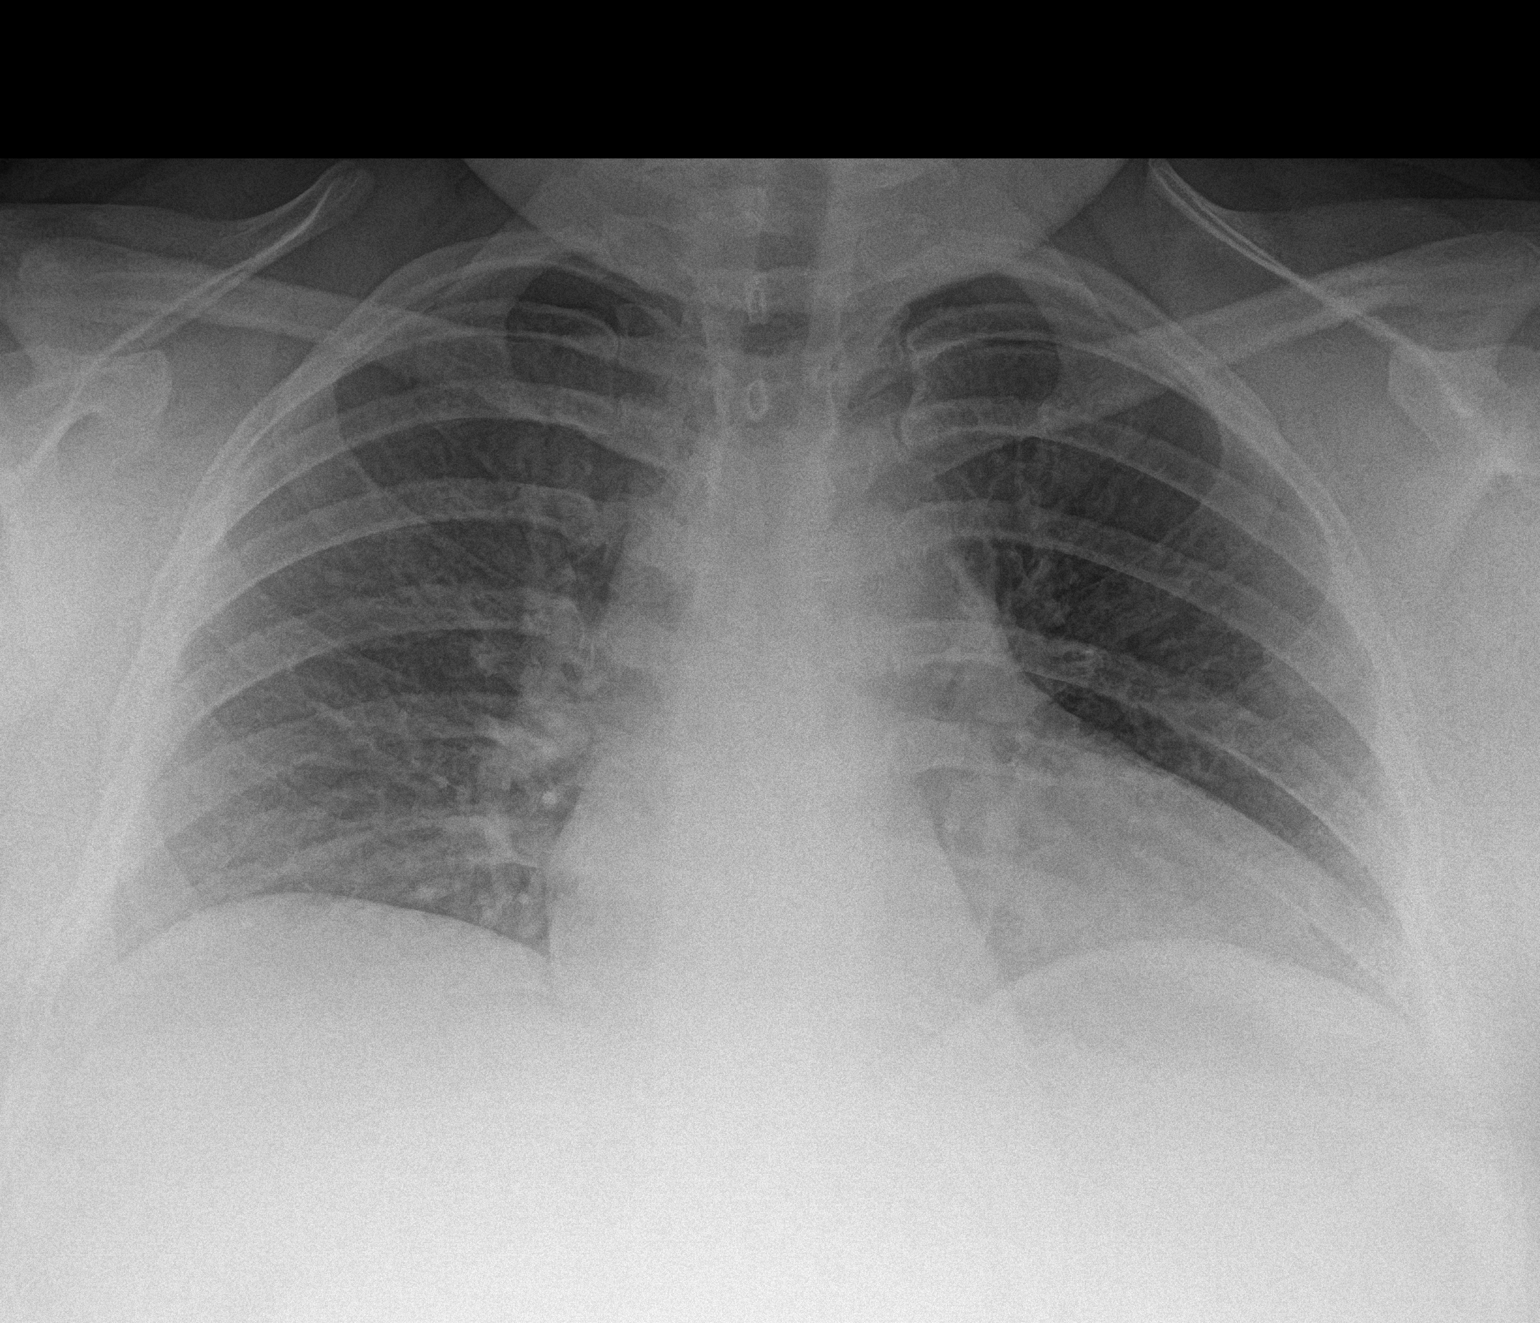

[1 of 1 positions shown; findings below may reference images not displayed]

FINDINGS: The heart size and mediastinal contours are within normal limits.
Both lungs are clear. The visualized skeletal structures are
unremarkable.
IMPRESSION: No active disease.

## 2022-06-28 ENCOUNTER — Other Ambulatory Visit: Payer: Self-pay

## 2022-06-28 DIAGNOSIS — E162 Hypoglycemia, unspecified: Secondary | ICD-10-CM

## 2022-07-04 ENCOUNTER — Other Ambulatory Visit (INDEPENDENT_AMBULATORY_CARE_PROVIDER_SITE_OTHER): Payer: Medicare HMO

## 2022-07-04 DIAGNOSIS — E162 Hypoglycemia, unspecified: Secondary | ICD-10-CM | POA: Diagnosis not present

## 2022-07-04 LAB — LIPID PANEL
Cholesterol: 174 mg/dL (ref 0–200)
HDL: 31.7 mg/dL — ABNORMAL LOW (ref >39.00)
NonHDL: 142.08
Total CHOL/HDL Ratio: 5
Triglycerides: 286 mg/dL — ABNORMAL HIGH (ref 0.0–149.0)
VLDL: 57.2 mg/dL — ABNORMAL HIGH (ref 0.0–40.0)

## 2022-07-04 LAB — COMPREHENSIVE METABOLIC PANEL
ALT: 71 U/L — ABNORMAL HIGH (ref 0–35)
AST: 38 U/L — ABNORMAL HIGH (ref 0–37)
Albumin: 4.2 g/dL (ref 3.5–5.2)
Alkaline Phosphatase: 65 U/L (ref 39–117)
BUN: 14 mg/dL (ref 6–23)
CO2: 25 mEq/L (ref 19–32)
Calcium: 9.5 mg/dL (ref 8.4–10.5)
Chloride: 105 mEq/L (ref 96–112)
Creatinine, Ser: 0.86 mg/dL (ref 0.40–1.20)
GFR: 94.26 mL/min (ref >60.00)
Glucose, Bld: 81 mg/dL (ref 70–99)
Potassium: 4.1 mEq/L (ref 3.5–5.1)
Sodium: 138 mEq/L (ref 135–145)
Total Bilirubin: 0.4 mg/dL (ref 0.2–1.2)
Total Protein: 7.4 g/dL (ref 6.0–8.3)

## 2022-07-04 LAB — MICROALBUMIN / CREATININE URINE RATIO
Creatinine,U: 213.8 mg/dL
Microalb Creat Ratio: 1.9 mg/g (ref 0.0–30.0)
Microalb, Ur: 4.1 mg/dL — ABNORMAL HIGH (ref 0.0–1.9)

## 2022-07-04 LAB — LDL CHOLESTEROL, DIRECT: Direct LDL: 115 mg/dL

## 2022-07-04 LAB — HEMOGLOBIN A1C: Hgb A1c MFr Bld: 5.7 % (ref 4.6–6.5)

## 2022-07-10 ENCOUNTER — Ambulatory Visit: Payer: Medicare HMO | Admitting: "Endocrinology

## 2022-07-26 ENCOUNTER — Other Ambulatory Visit (HOSPITAL_COMMUNITY): Payer: Self-pay | Admitting: Surgery

## 2022-08-02 ENCOUNTER — Encounter (HOSPITAL_COMMUNITY)
Admission: RE | Admit: 2022-08-02 | Discharge: 2022-08-02 | Disposition: A | Discharge status: Home / Self Care | Payer: Medicare HMO | Source: Ambulatory Visit | Attending: Surgery | Admitting: Surgery

## 2022-08-02 DIAGNOSIS — Z01810 Encounter for preprocedural cardiovascular examination: Secondary | ICD-10-CM | POA: Insufficient documentation

## 2022-08-02 DIAGNOSIS — Z01818 Encounter for other preprocedural examination: Secondary | ICD-10-CM

## 2022-08-09 ENCOUNTER — Other Ambulatory Visit: Payer: Self-pay

## 2022-08-21 ENCOUNTER — Ambulatory Visit (HOSPITAL_COMMUNITY)
Admission: RE | Admit: 2022-08-21 | Discharge: 2022-08-21 | Disposition: A | Discharge status: Home / Self Care | Payer: Medicare HMO | Source: Ambulatory Visit | Attending: Surgery | Admitting: Surgery

## 2022-08-28 ENCOUNTER — Ambulatory Visit: Payer: Medicare HMO | Admitting: "Endocrinology

## 2022-09-02 ENCOUNTER — Encounter: Payer: Self-pay | Admitting: Dietician

## 2022-09-02 ENCOUNTER — Encounter: Payer: Medicare HMO | Attending: Surgery | Admitting: Dietician

## 2022-09-02 VITALS — Ht 70.0 in | Wt 359.4 lb

## 2022-09-02 DIAGNOSIS — E669 Obesity, unspecified: Secondary | ICD-10-CM | POA: Diagnosis present

## 2022-09-02 DIAGNOSIS — Z6841 Body Mass Index (BMI) 40.0 and over, adult: Secondary | ICD-10-CM | POA: Diagnosis not present

## 2022-09-02 NOTE — Progress Notes (Signed)
Nutrition Assessment for Bariatric Surgery: Pre-Surgery Behavioral and Nutrition Intervention Program   Medical Nutrition Therapy  Appt Start Time: 7:50    End Time: 8:45  Patient was seen on 09/02/2022 for Pre-Operative Nutrition Assessment. Purpose of todays visit  enhance perioperative outcomes along with a healthy weight maintenance   Referral stated Supervised Weight Loss (SWL) visits needed: 6 Months  Planned surgery: Sleeve Gastrectomy  Pt expectation of surgery: jump start weight loss   NUTRITION ASSESSMENT   Anthropometrics  Start weight at [facility]: 259.4 lbs (date: 09/02/2022) Height: 70 in BMI: 51.57 kg/m2     Clinical   Pharmacotherapy: History of weight loss medication used: Ozempic for awhile, stating that insurance did not pay for it after 2 pens.  Medical hx: asthma, HTN, hypercholesterolemia, obesity, prediabetes Medications: metformin;   Labs: A1c 5.7; Vit D 11.4 Notable signs/symptoms: none noted Any previous deficiencies? No  Evaluation of Nutritional Deficiencies: Micronutrient Nutrition Focused Physical Exam: Hair: No issues observed Eyes: No issues observed Mouth: No issues observed Neck: No issues observed Nails: No issues observed Skin: No issues observed  Lifestyle & Dietary Hx  Pt states she used to drink a lot of soda, stating now it makes her sick.  Current Physical Activity Recommendations state 150 minutes per week of moderate to vigorous movement including Cardio and 1-2 days of resistance activities as well as flexibility/balance activities:  Pts current physical activity: walking daily at the mall, 3-4 hours, with 100% recommendation reached   Sleep Hygiene: duration and quality: good; 6-8 hours a night  Current Patient Perceived Stress Level as stated by pt on a scale of 1-10:  3       Stress Management Techniques: listen to music; draw/color  According to the Dietary Guidelines for Americans Recommendation: equivalent 1.5-2 cups  fruits per day, equivalent 2-3 cups vegetables per day and at least half all grains whole  Fruit servings per day (on average): 1-2, meeting 66-100% recommendation  Non-starchy vegetable servings per day (on average): 2-3, meeting 100% recommendation  Whole Grains per day (on average): 1-2  Number of meals missed/skipped per week out of 21: 7  24-Hr Dietary Recall First Meal: skip or oatmeal Snack:  Second Meal: lean cuisine  Snack: banana or apple Third Meal: lean cuisine or green beans with salmon or baked chicken Snack: veggie straws or fruit Beverages: water, occasionally apple or peach juice  Alcoholic beverages per week: 0   Estimated Energy Needs Calories: 1600   NUTRITION DIAGNOSIS  Overweight/obesity (-3.3) related to past poor dietary habits and physical inactivity as evidenced by patient w/ planned sleeve surgery following dietary guidelines for continued weight loss.    NUTRITION INTERVENTION  Nutrition counseling (C-1) and education (E-2) to facilitate bariatric surgery goals.  Educated pt on micronutrient deficiencies post-surgery and behavioral/dietary strategies to start in order to mitigate that risk   Behavioral and Dietary Interventions Pre-Op Goals Reviewed with the Patient Nutrition: Healthy Eating Behaviors Switch to non-caloric, non-carbonated and non-caffeinated beverages such as  water, unsweetened tea, Crystal Light and zero calorie beverages (aim for 64 oz. per day) Cut out grazing between meals or at night  Find a protein shake you like Eat every 3-5 hours        Eliminate distractions while eating (TV, computer, reading, driving, texting) Take 16-10 minutes to eat a meal  Decrease high sugar foods/decrease high fat/fried foods Eliminate alcoholic beverages Increase protein intake (eggs, fish, chicken, yogurt) before surgery Eat non starchy vegetables 2 times a day  7 days a week Eat complex carbohydrates such as whole grains and fruits    Behavioral Modification: Physical Activity Increase my usual daily activity (use stairs, park farther, etc.) Engage in _______________________  activity  _______ minutes ______ times per week  Other:    _________________________________________________________________     Problem Solving I will think about my usual eating patterns and how to tweak them How can my friends and family support me Barriers to starting my changes Learn and understand appetite verses hunger   Healthy Coping Allow for ___________ activities per week to help me manage stress Reframe negative thoughts I will keep a picture of someone or something that is my inspiration & look at it daily   Monitoring  Weigh myself once a week  Measure my progress by monitoring how my clothes fit Keep a food record of what I eat and drink for the next ________ (time period) Take pictures of what I eat and drink for the next ________ (time period) Use an app to count steps/day for the next_______ (time period) Measure my progress such as increased energy and more restful sleep Monitor your acid reflux and bowel habits, are they getting better?   *Goals that are bolded indicate the pt would like to start working towards these  Handouts Provided Include  Bariatric Surgery handouts (Nutrition Visits, Pre Surgery Behavioral Change Goals, Protein Shakes Brands to Choose From, Vitamins & Mineral Supplementation)  Learning Style & Readiness for Change Teaching method utilized: Visual, Auditory, and hands on  Demonstrated degree of understanding via: Teach Back  Readiness Level: preparation Barriers to learning/adherence to lifestyle change: nothing identified  RD's Notes for Next Visit Patient progress toward chosen goals    MONITORING & EVALUATION Dietary intake, weekly physical activity, body weight, and preoperative behavioral change goals   Next Steps  Patient is to follow up at [facility] in two weeks for first SWL visit.

## 2022-09-16 ENCOUNTER — Ambulatory Visit: Payer: Medicare HMO | Admitting: Dietician

## 2022-09-26 ENCOUNTER — Encounter: Payer: Medicare HMO | Attending: Surgery | Admitting: Dietician

## 2022-09-26 ENCOUNTER — Encounter: Payer: Self-pay | Admitting: Dietician

## 2022-09-26 VITALS — Ht 70.0 in | Wt 358.0 lb

## 2022-09-26 DIAGNOSIS — Z6841 Body Mass Index (BMI) 40.0 and over, adult: Secondary | ICD-10-CM | POA: Insufficient documentation

## 2022-09-26 DIAGNOSIS — E669 Obesity, unspecified: Secondary | ICD-10-CM | POA: Insufficient documentation

## 2022-09-26 DIAGNOSIS — Z713 Dietary counseling and surveillance: Secondary | ICD-10-CM | POA: Diagnosis not present

## 2022-09-26 NOTE — Progress Notes (Signed)
Supervised Weight Loss Visit Bariatric Nutrition Education Appt Start Time: 2:42    End Time: 2:58  Planned surgery: Sleeve Gastrectomy  Pt expectation of surgery: jump start weight loss  Referral stated Supervised Weight Loss (SWL) visits needed: 6 Months 1 out of 6 SWL Appointments   NUTRITION ASSESSMENT   Anthropometrics  Start weight at [facility]: 259.4 lbs (date: 09/02/2022) Height: 70 in Weight today: 358.0 lb BMI: 51.57 kg/m2     Clinical   Medical hx: asthma, HTN, hypercholesterolemia, obesity, prediabetes Medications: metformin;   Labs: A1c 5.7; Vit D 11.4 Notable signs/symptoms: none noted Any previous deficiencies? No  Lifestyle & Dietary Hx  Pt states her cousin passed from a motorcycle accident. Pt states she cut out the juice, stating just water now.  Estimated daily fluid intake: 64+ oz Supplements: Vit D Current average weekly physical activity: walking with Dad and dog daily, 60 min  24-Hr Dietary Recall First Meal: oatmeal or banana Snack:  Second Meal: tuna with saltine crackers, green beans Snack: banana or apple or veggie straws Third Meal: lean cuisine or green beans with salmon or baked chicken or hot dog and dried mangos Snack: veggie straws or fruit Beverages: water  Estimated Energy Needs Calories: 1500  NUTRITION DIAGNOSIS  Overweight/obesity ([place]-3.3) related to past poor dietary habits and physical inactivity as evidenced by patient w/ planned sleeve surgery following dietary guidelines for continued weight loss.  NUTRITION INTERVENTION  Nutrition counseling (C-1) and education (E-2) to facilitate bariatric surgery goals.  Encouraged pt to continue to eat balanced meals inclusive of non starchy vegetables 2 times a day 7 days a week Encouraged pt to choose lean protein sources: limiting beef, pork, sausage, hotdogs, and lunch meat Encouraged pt to continue to drink a minium 64 fluid ounces with half being plain water to satisfy proper  hydration   Why you need complex carbohydrates: Whole grains and other complex carbohydrates are required to have a healthy diet. Whole grains provide fiber which can help with blood glucose levels and help keep you satiated. Fruits and starchy vegetables provide essential vitamins and minerals required for immune function, eyesight support, brain support, bone density, wound healing and many other functions within the body. According to the current evidenced based 2020-2025 Dietary Guidelines for Americans, complex carbohydrates are part of a healthy eating pattern which is associated with a decreased risk for type 2 diabetes, cancers, and cardiovascular disease.   Pre-Op Goals Progress & New Goals Continue: physical activity walking the dog daily for 60 minutes Continue: Eating every 3-5 New: have a protein choice with every meal or snack; track your protein, aim for 60 grams a day  Handouts Provided Include  Meal Ideas List of Protein foods with values  Learning Style & Readiness for Change Teaching method utilized: Visual & Auditory  Demonstrated degree of understanding via: Teach Back  Readiness Level: preparation Barriers to learning/adherence to lifestyle change: nothing identified  RD's Notes for next Visit  Patient progress toward chosen goals.  MONITORING & EVALUATION Dietary intake, weekly physical activity, body weight, and pre-op goals in 1 month.   Next Steps  Patient is to return to [facility] in one month for next SWL visit.

## 2022-10-28 ENCOUNTER — Encounter: Payer: Medicare HMO | Attending: Surgery | Admitting: Dietician

## 2022-10-28 ENCOUNTER — Encounter: Payer: Self-pay | Admitting: Dietician

## 2022-10-28 VITALS — Ht 70.0 in | Wt 358.1 lb

## 2022-10-28 DIAGNOSIS — Z713 Dietary counseling and surveillance: Secondary | ICD-10-CM | POA: Insufficient documentation

## 2022-10-28 DIAGNOSIS — E669 Obesity, unspecified: Secondary | ICD-10-CM | POA: Diagnosis present

## 2022-10-28 DIAGNOSIS — Z6841 Body Mass Index (BMI) 40.0 and over, adult: Secondary | ICD-10-CM | POA: Diagnosis not present

## 2022-10-28 DIAGNOSIS — Z01818 Encounter for other preprocedural examination: Secondary | ICD-10-CM | POA: Diagnosis present

## 2022-10-28 NOTE — Progress Notes (Signed)
Supervised Weight Loss Visit Bariatric Nutrition Education Appt Start Time: 10:10    End Time: 10:30  Planned surgery: Sleeve Gastrectomy  Pt expectation of surgery: jump start weight loss  Referral stated Supervised Weight Loss (SWL) visits needed: 6 Months 2 out of 6 SWL Appointments   NUTRITION ASSESSMENT   Anthropometrics  Start weight at [facility]: 259.4 lbs (date: 09/02/2022) Height: 70 in Weight today: 358.1 lb BMI: 51.38 kg/m2     Clinical   Medical hx: asthma, HTN, hypercholesterolemia, obesity, prediabetes Medications: metformin;   Labs: A1c 5.7; Vit D 11.4 Notable signs/symptoms: none noted Any previous deficiencies? No  Lifestyle & Dietary Hx  Pt states she has been practicing not drinking with meals or snacks, stating some textures are more difficult. Pt states she has been eating protein with every meal and about half of her snacks. Pt states they had her cousins funeral, stating his mother is having a hard time, and it threw her appointment off. Pt states he stopped drinking the peach juice.  Estimated daily fluid intake: 64+ oz Supplements: Vit D Current average weekly physical activity: walking with Dad daily and dog daily, 60 min; pt states her dog likes to walk fast or run, stating her intensity is fast when she is walking the dog.  24-Hr Dietary Recall First Meal: oatmeal or banana Snack:  Second Meal: tuna with saltine crackers, green beans Snack: banana or apple or veggie straws Third Meal: lean cuisine or green beans with salmon or baked chicken or hot dog and dried mangos Snack: veggie straws or fruit Beverages: water  Estimated Energy Needs Calories: 1500  NUTRITION DIAGNOSIS  Overweight/obesity ([place]-3.3) related to past poor dietary habits and physical inactivity as evidenced by patient w/ planned sleeve surgery following dietary guidelines for continued weight loss.  NUTRITION INTERVENTION  Nutrition counseling (C-1) and education (E-2) to  facilitate bariatric surgery goals.  Encouraged pt to continue to eat balanced meals inclusive of non starchy vegetables 2 times a day 7 days a week Encouraged pt to choose lean protein sources: limiting beef, pork, sausage, hotdogs, and lunch meat Encouraged pt to continue to drink a minium 64 fluid ounces with half being plain water to satisfy proper hydration   Why you need complex carbohydrates: Whole grains and other complex carbohydrates are required to have a healthy diet. Whole grains provide fiber which can help with blood glucose levels and help keep you satiated. Fruits and starchy vegetables provide essential vitamins and minerals required for immune function, eyesight support, brain support, bone density, wound healing and many other functions within the body. According to the current evidenced based 2020-2025 Dietary Guidelines for Americans, complex carbohydrates are part of a healthy eating pattern which is associated with a decreased risk for type 2 diabetes, cancers, and cardiovascular disease.   Pre-Op Goals Progress & New Goals Continue: physical activity walking the dog daily for 60 minutes. Continue: Eating every 3-5 Continue/re-engage: have a protein choice with every meal or snack; track your protein, aim for 60 grams a day. Continue: practice not drinking with meals or snacks. New: aim at least 2 servings a day of non-starchy vegetables.  Handouts Provided Include  Bariatric MyPlate  Learning Style & Readiness for Change Teaching method utilized: Visual & Auditory  Demonstrated degree of understanding via: Teach Back  Readiness Level: preparation Barriers to learning/adherence to lifestyle change: nothing identified  RD's Notes for next Visit  Patient progress toward chosen goals.  MONITORING & EVALUATION Dietary intake, weekly physical activity,  body weight, and pre-op goals in 1 month.   Next Steps  Patient is to return to [facility] in one month for next SWL  visit.

## 2022-11-14 ENCOUNTER — Ambulatory Visit: Payer: Medicare HMO | Admitting: Dietician

## 2022-11-15 ENCOUNTER — Ambulatory Visit: Payer: Medicare HMO | Admitting: "Endocrinology

## 2022-11-26 ENCOUNTER — Encounter: Payer: Self-pay | Admitting: Dietician

## 2022-11-26 ENCOUNTER — Encounter: Payer: Medicare HMO | Attending: Surgery | Admitting: Dietician

## 2022-11-26 VITALS — Ht 70.0 in | Wt 360.4 lb

## 2022-11-26 DIAGNOSIS — Z6841 Body Mass Index (BMI) 40.0 and over, adult: Secondary | ICD-10-CM | POA: Diagnosis not present

## 2022-11-26 DIAGNOSIS — Z01818 Encounter for other preprocedural examination: Secondary | ICD-10-CM | POA: Diagnosis not present

## 2022-11-26 DIAGNOSIS — E669 Obesity, unspecified: Secondary | ICD-10-CM | POA: Diagnosis present

## 2022-11-26 DIAGNOSIS — Z713 Dietary counseling and surveillance: Secondary | ICD-10-CM | POA: Diagnosis not present

## 2022-11-26 DIAGNOSIS — R7303 Prediabetes: Secondary | ICD-10-CM | POA: Diagnosis not present

## 2022-11-26 DIAGNOSIS — Z7984 Long term (current) use of oral hypoglycemic drugs: Secondary | ICD-10-CM | POA: Insufficient documentation

## 2022-11-26 NOTE — Progress Notes (Signed)
Supervised Weight Loss Visit Bariatric Nutrition Education Appt Start Time: 10:26    End Time: 10:30  Planned surgery: Sleeve Gastrectomy  Pt expectation of surgery: jump start weight loss  Referral stated Supervised Weight Loss (SWL) visits needed: 6 Months  3 out of 6 SWL Appointments   NUTRITION ASSESSMENT   Anthropometrics  Start weight at [facility]: 359.4 lbs (date: 09/02/2022) Height: 70 in Weight today: 360.4 lb BMI: 51.71 kg/m2     Clinical   Medical hx: asthma, HTN, hypercholesterolemia, obesity, prediabetes Medications: metformin;   Labs: A1c 5.7; Vit D 11.4 Notable signs/symptoms: none noted Any previous deficiencies? 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 Pt states she takes the stairs at each floor. 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 Pt states she has been tracking her protein, stating she forgot her tracking sheet. Pt states she has been practicing not drinking with meals.  Estimated daily fluid intake: 64-126 oz Supplements: Vit D Current average weekly physical activity: walking with Dad daily and dog daily, 60 min; pt states her dog likes to walk fast or run, stating her intensity is fast when she is walking the dog.  24-Hr Dietary Recall First Meal: oatmeal or banana Snack:  Second Meal: tuna with saltine crackers, green beans Snack: banana or apple or veggie straws Third Meal: lean cuisine or green beans with salmon or baked chicken or hot dog and dried mangos Snack: veggie straws or fruit Beverages: water  Estimated Energy Needs Calories: 1500  NUTRITION DIAGNOSIS  Overweight/obesity ([place]-3.3) related to past poor dietary habits and physical inactivity as evidenced by patient w/ planned sleeve surgery following dietary guidelines for continued weight loss.  NUTRITION INTERVENTION  Nutrition counseling (C-1) and  education (E-2) to facilitate bariatric surgery goals.  Encouraged pt to continue to eat balanced meals inclusive of non starchy vegetables 2 times a day 7 days a week Encouraged pt to choose lean protein sources: limiting beef, pork, sausage, hotdogs, and lunch meat Encouraged pt to continue to drink a minium 64 fluid ounces with half being plain water to satisfy proper hydration   Why you need complex carbohydrates: Whole grains and other complex carbohydrates are required to have a healthy diet. Whole grains provide fiber which can help with blood glucose levels and help keep you satiated. Fruits and starchy vegetables provide essential vitamins and minerals required for immune function, eyesight support, brain support, bone density, wound healing and many other functions within the body. According to the current evidenced based 2020-2025 Dietary Guidelines for Americans, complex carbohydrates are part of a healthy eating pattern which is associated with a decreased risk for type 2 diabetes, cancers, and cardiovascular disease.  Encouraged pt to meal plan/prep. Pack lunch for work.  Pre-Op Goals Progress & New Goals Continue: physical activity walking the dog daily for 60 minutes. Continue: Eating every 3-5 Continue/re-engage: have a protein choice with every meal or snack; track your protein, aim for 60 grams a day. Continue: practice not drinking with meals or snacks. Continue: aim at least 2 servings a day of non-starchy vegetables. 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   Handouts Provided Include  Bariatric MyPlate  Learning Style & Readiness for Change Teaching method utilized: Visual & Auditory  Demonstrated degree of understanding via: Teach Back  Readiness Level: preparation Barriers  to learning/adherence to lifestyle change: nothing identified  RD's Notes for next Visit  Patient progress toward chosen goals.  MONITORING &  EVALUATION Dietary intake, weekly physical activity, body weight, and pre-op goals in 1 month.   Next Steps  Patient is to return to [facility] in one month for next SWL visit.

## 2022-12-24 ENCOUNTER — Encounter: Payer: Medicare HMO | Attending: General Surgery | Admitting: Dietician

## 2022-12-24 ENCOUNTER — Encounter: Payer: Self-pay | Admitting: Dietician

## 2022-12-24 VITALS — Ht 70.0 in | Wt 364.3 lb

## 2022-12-24 DIAGNOSIS — Z6841 Body Mass Index (BMI) 40.0 and over, adult: Secondary | ICD-10-CM | POA: Insufficient documentation

## 2022-12-24 DIAGNOSIS — Z713 Dietary counseling and surveillance: Secondary | ICD-10-CM | POA: Diagnosis not present

## 2022-12-24 DIAGNOSIS — E669 Obesity, unspecified: Secondary | ICD-10-CM | POA: Insufficient documentation

## 2022-12-24 NOTE — Progress Notes (Signed)
Supervised Weight Loss Visit Bariatric Nutrition Education Appt Start Time: 10:57    End Time: 11:15  Planned surgery: Sleeve Gastrectomy  Pt expectation of surgery: jump start weight loss  Referral stated Supervised Weight Loss (SWL) visits needed: 6 Months  4 out of 6 SWL Appointments   NUTRITION ASSESSMENT   Anthropometrics  Start weight at [facility]: 359.4 lbs (date: 09/02/2022) Height: 70 in Weight today: 364.3 lb BMI: 52.27 kg/m2     Clinical   Medical hx: asthma, HTN, hypercholesterolemia, obesity, prediabetes Medications: metformin  Labs: A1c 5.7; Vit D 11.4, glucose 116, Ca 10.3 Notable signs/symptoms: none noted Any previous deficiencies? No  Lifestyle & Dietary Hx  Pt states she has been doing well with meal planning and prepping, stating she is eating more vegetables and getting a protein with every meal. Pt states her mother is helping her with getting a protein with every meal and snack. Pt states she is tracking her steps daily and walking her dog 2 times a day for an average of 60+ minutes each day.  Estimated daily fluid intake: 64-126 oz Supplements: Vit D Current average weekly physical activity: walking with Dad daily and dog daily, 60 min; pt states her dog likes to walk fast or run, stating her intensity is fast when she is walking the dog.  24-Hr Dietary Recall First Meal: oatmeal or banana Snack:  Second Meal: tuna with saltine crackers, green beans Snack: banana or apple or veggie straws Third Meal: lean cuisine or green beans with salmon or baked chicken or hot dog and dried mangos or salmon with asparagus Snack: veggie straws or fruit Beverages: water  Estimated Energy Needs Calories: 1500  NUTRITION DIAGNOSIS  Overweight/obesity ([place]-3.3) related to past poor dietary habits and physical inactivity as evidenced by patient w/ planned sleeve surgery following dietary guidelines for continued weight loss.  NUTRITION INTERVENTION  Nutrition  counseling (C-1) and education (E-2) to facilitate bariatric surgery goals.  Encouraged pt to continue to eat balanced meals inclusive of non starchy vegetables 2 times a day 7 days a week Encouraged pt to choose lean protein sources: limiting beef, pork, sausage, hotdogs, and lunch meat Encouraged pt to continue to drink a minium 64 fluid ounces with half being plain water to satisfy proper hydration   Why you need complex carbohydrates: Whole grains and other complex carbohydrates are required to have a healthy diet. Whole grains provide fiber which can help with blood glucose levels and help keep you satiated. Fruits and starchy vegetables provide essential vitamins and minerals required for immune function, eyesight support, brain support, bone density, wound healing and many other functions within the body. According to the current evidenced based 2020-2025 Dietary Guidelines for Americans, complex carbohydrates are part of a healthy eating pattern which is associated with a decreased risk for type 2 diabetes, cancers, and cardiovascular disease.  Encouraged pt to meal plan/prep. Pack lunch for work. Encouraged patient to honor their body's internal hunger and fullness cues.  Throughout the day, check in mentally and rate hunger. Stop eating when satisfied not full regardless of how much food is left on the plate.  Get more if still hungry 20-30 minutes later.  The key is to honor satisfaction so throughout the meal, rate fullness factor and stop when comfortably satisfied not physically full. The key is to honor hunger and fullness without any feelings of guilt or shame.  Pay attention to what the internal cues are, rather than any external factors. This will enhance the  confidence you have in listening to your own body and following those internal cues enabling you to increase how often you eat when you are hungry not out of appetite and stop when you are satisfied not full.   Pre-Op Goals Progress &  New Goals Continue: physical activity walking the dog daily for 60+ minutes. Continue: Eating every 3-5 Continue/re-engage: have a protein choice with every meal or snack; track your protein, aim for 60 grams a day. Re-engage: practice not drinking with meals or snacks. Continue: aim at least 2 servings a day of non-starchy vegetables. 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  New: decrease portion sizes, slow down when eating (aim for 30 minutes); aim for satisfaction before fullness; get rid of distractions.  Handouts Provided Include  Bariatric MyPlate  Learning Style & Readiness for Change Teaching method utilized: Visual & Auditory  Demonstrated degree of understanding via: Teach Back  Readiness Level: preparation Barriers to learning/adherence to lifestyle change: nothing identified  RD's Notes for next Visit  Patient progress toward chosen goals.  MONITORING & EVALUATION Dietary intake, weekly physical activity, body weight, and pre-op goals in 1 month.   Next Steps  Patient is to return to [facility] in one month for next SWL visit.

## 2022-12-29 ENCOUNTER — Other Ambulatory Visit: Payer: Self-pay

## 2022-12-29 ENCOUNTER — Emergency Department (HOSPITAL_COMMUNITY)
Admission: EM | Admit: 2022-12-29 | Discharge: 2022-12-29 | Disposition: A | Discharge status: Home / Self Care | Payer: Medicare HMO | Attending: Emergency Medicine | Admitting: Emergency Medicine

## 2022-12-29 ENCOUNTER — Encounter (HOSPITAL_COMMUNITY): Payer: Self-pay

## 2022-12-29 ENCOUNTER — Emergency Department (HOSPITAL_COMMUNITY): Payer: Medicare HMO

## 2022-12-29 DIAGNOSIS — X509XXA Other and unspecified overexertion or strenuous movements or postures, initial encounter: Secondary | ICD-10-CM | POA: Diagnosis not present

## 2022-12-29 DIAGNOSIS — Z91040 Latex allergy status: Secondary | ICD-10-CM | POA: Diagnosis not present

## 2022-12-29 DIAGNOSIS — J45909 Unspecified asthma, uncomplicated: Secondary | ICD-10-CM | POA: Insufficient documentation

## 2022-12-29 DIAGNOSIS — I10 Essential (primary) hypertension: Secondary | ICD-10-CM | POA: Diagnosis not present

## 2022-12-29 DIAGNOSIS — S63501A Unspecified sprain of right wrist, initial encounter: Secondary | ICD-10-CM | POA: Diagnosis not present

## 2022-12-29 DIAGNOSIS — E119 Type 2 diabetes mellitus without complications: Secondary | ICD-10-CM | POA: Insufficient documentation

## 2022-12-29 DIAGNOSIS — M25531 Pain in right wrist: Secondary | ICD-10-CM | POA: Diagnosis present

## 2022-12-29 DIAGNOSIS — Z7951 Long term (current) use of inhaled steroids: Secondary | ICD-10-CM | POA: Diagnosis not present

## 2022-12-29 DIAGNOSIS — Y9301 Activity, walking, marching and hiking: Secondary | ICD-10-CM | POA: Diagnosis not present

## 2022-12-29 NOTE — ED Triage Notes (Signed)
Friday evening pt dog pulled her down. Right arm has been hurting since.pt has limited movement to arm. No obvious deformity. Most pain Is in wrist.

## 2022-12-29 NOTE — Discharge Instructions (Signed)
You are seen in the emergency department today for wrist pain.  Your examination was thankfully negative for any fractures or dislocations.  You are placed into a wrist brace for suspected wrist sprain.  I would recommend following up with your primary care provider for further evaluation or return if new symptoms are developing.  You may require further imaging if symptoms are struggling to improve.  May take Tylenol, ibuprofen or Aleve as needed at home for pain control.

## 2022-12-30 NOTE — ED Provider Notes (Signed)
 [place] EMERGENCY DEPARTMENT AT [facility] Provider Note   CSN: [id number] Arrival date & time: 12/29/22  1927     History Chief Complaint  Patient presents with   Arm Injury    Right arm    Susan Butler is a 26 y.o. female. Patient with past history significant for diabetes, HTN, and asthma presents to the ED with concerns of wrist pain. Reports that she injured her right wrist after being pulled to the ground by her dog while walking it on a leash. States this occurred 2 days ago with pain and stiffness worsening over that time. Denies any numbness or tingling. No obvious bony deformity. States that she does not like taking medicine so she has held off on taking any OTC medications for pain. No other area of focal tenderness. Patient is right handed.   Arm Injury      Home Medications Prior to Admission medications   Medication Sig Start Date End Date Taking? Authorizing Provider  albuterol (VENTOLIN HFA) 108 (90 Base) MCG/ACT inhaler Inhale 2 puffs into the lungs as needed. 10/04/21   [provider]  benzonatate (TESSALON) 100 MG capsule Take 1 capsule (100 mg total) by mouth every 8 (eight) hours. 11/27/20   Redwine, Madison A, PA-C  doxycycline (VIBRAMYCIN) 100 MG capsule Take 1 capsule (100 mg total) by mouth 2 (two) times daily. One po bid x 7 days 05/24/21   Mancel Bale, MD  fluticasone-salmeterol (ADVAIR DISKUS) 500-50 MCG/ACT AEPB Inhale 1 puff into the lungs in the morning and at bedtime.    [provider]  naproxen (NAPROSYN) 500 MG tablet Take 1 tablet (500 mg total) by mouth 2 (two) times daily with a meal. 07/19/20   Garlon Hatchet, PA-C      Allergies    Bee venom, Latex, Lithium, and Seroquel [quetiapine]    Review of Systems   Review of Systems  Musculoskeletal:        Wrist pain  All other systems reviewed and are negative.   Physical Exam Updated Vital Signs BP 131/73 (BP Location: Left Wrist)   Pulse 90    Temp 98.7 F (37.1 C) (Oral)   Resp 20   Ht 5\' 11"  (1.803 m)   Wt 104.3 kg   LMP 12/26/2022   SpO2 96%   BMI 32.08 kg/m  Physical Exam Vitals and nursing note reviewed.  Constitutional:      General: She is not in acute distress.    Appearance: She is well-developed.  HENT:     Head: Normocephalic and atraumatic.  Eyes:     Conjunctiva/sclera: Conjunctivae normal.  Cardiovascular:     Rate and Rhythm: Normal rate and regular rhythm.     Heart sounds: No murmur heard. Pulmonary:     Effort: Pulmonary effort is normal. No respiratory distress.     Breath sounds: Normal breath sounds.  Abdominal:     Palpations: Abdomen is soft.     Tenderness: There is no abdominal tenderness.  Musculoskeletal:        General: Tenderness and signs of injury present. No swelling or deformity.       Arms:     Cervical back: Neck supple.     Comments: ROM impaired at extremes due to pain. Largely intact right wrist motion in all direction. TTP primarily along the radial aspect of the wrist. No snuffbox tenderness.  Skin:    General: Skin is warm and dry.     Capillary  Refill: Capillary refill takes less than 2 seconds.  Neurological:     Mental Status: She is alert.  Psychiatric:        Mood and Affect: Mood normal.     ED Results / Procedures / Treatments   Labs (all labs ordered are listed, but only abnormal results are displayed) Labs Reviewed - No data to display  EKG None  Radiology DG Wrist Complete Right  Result Date: 12/29/2022 CLINICAL DATA:  Fall, right wrist pain EXAM: RIGHT WRIST - COMPLETE 3+ VIEW COMPARISON:  None Available. FINDINGS: There is no evidence of fracture or dislocation. There is no evidence of arthropathy or other focal bone abnormality. Soft tissues are unremarkable. IMPRESSION: Negative. Electronically Signed   By: Helyn Numbers M.D.   On: 12/29/2022 20:35    Procedures Procedures   Medications Ordered in ED Medications - No data to display  ED  Course/ Medical Decision Making/ A&P                               Medical Decision Making Amount and/or Complexity of Data Reviewed Radiology: ordered.   This patient presents to the ED for concern of wrist pain. Differential diagnosis includes wrist sprain, wrist dislocation, TFCC injury, radial styloid fracture   Imaging Studies ordered:  I ordered imaging studies including xray right wrist  I independently visualized and interpreted imaging which showed negative for any acute fracture or dislocation I agree with the radiologist interpretation   Problem List / ED Course:  Patient presents to the ED with concerns of right wrist pain. She states that she was pulled to the ground by her dog when walking it 2 days ago. Since then, progressively worsening right wrist pain and impaired mobility. Right handed. Initially, exam largely unremarkable but there is point tenderness over the right wrist along the radial aspect. No bruising or swelling seen. Patient has been avoid any OTC medications for pain control as she states she "tries to avoid any medicine". Will obtain xray of right wrist given pain. Suspect wrist sprain but imaging to rule out potential fractures. Xray negative for any findings suggesting fracture, dislocation, or other bony abnormality. Suspect symptoms are due to wrist sprain so will place patient in wrist brace for comfort and support. Advised patient to take Tylenol or ibuprofen as needed for pain control. Also encouraged patient to follow up with PCP for further evaluation to ensure improvement in symptoms. Discussed return precautions. Discharged home in stable condition with wrist brace.  Final Clinical Impression(s) / ED Diagnoses Final diagnoses:  Sprain of right wrist, initial encounter    Rx / DC Orders ED Discharge Orders     None         Smitty Knudsen, PA-C 12/30/22 1159    Laurence Spates, MD 01/01/23 1445

## 2023-01-21 ENCOUNTER — Encounter: Payer: Medicare HMO | Attending: Surgery | Admitting: Dietician

## 2023-01-21 ENCOUNTER — Encounter: Payer: Self-pay | Admitting: Dietician

## 2023-01-21 ENCOUNTER — Ambulatory Visit: Payer: Medicare HMO | Admitting: Dietician

## 2023-01-21 VITALS — Ht 70.0 in | Wt 369.6 lb

## 2023-01-21 DIAGNOSIS — Z713 Dietary counseling and surveillance: Secondary | ICD-10-CM | POA: Diagnosis not present

## 2023-01-21 DIAGNOSIS — Z6841 Body Mass Index (BMI) 40.0 and over, adult: Secondary | ICD-10-CM | POA: Diagnosis not present

## 2023-01-21 DIAGNOSIS — E669 Obesity, unspecified: Secondary | ICD-10-CM | POA: Diagnosis present

## 2023-01-21 NOTE — Progress Notes (Signed)
Supervised Weight Loss Visit Bariatric Nutrition Education Appt Start Time:   4:38  End Time: 5:01  Planned surgery: Sleeve Gastrectomy  Pt expectation of surgery: jump start weight loss  Referral stated Supervised Weight Loss (SWL) visits needed: 6 Months  5 out of 6 SWL Appointments   NUTRITION ASSESSMENT   Anthropometrics  Start weight at [facility]: 359.4 lbs (date: 09/02/2022) Height: 70 in Weight today: 369.6 lb BMI: 53.03 kg/m2     Clinical   Medical hx: asthma, HTN, hypercholesterolemia, obesity, prediabetes Medications: metformin  Labs: A1c 5.7; Vit D 11.4, glucose 116, Ca 10.3 Notable signs/symptoms: none noted Any previous deficiencies? 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 Pt states she tracks her steps and takes her dog for a walk daily. Pt states she is always moving. Pt states she is not drinking with her meals. Pt states she bought some containers for meal prepping, stating she is doing well eating a protein with every meal. Pt states the meal containers are a big help, stating she packs her lunch every day for work. Pt states her mother had bariatric surgery, stating her mother preps food for her as well. Pt states she got a Cirkul bottle to add flavoring, stating she puts it on the lowest setting.  Estimated daily fluid intake: 64-126 oz Supplements: Vit D Current average weekly physical activity: walking with Dad daily and dog daily, 60 min; pt states her dog likes to walk fast or run, stating her intensity is fast when she is walking the dog.  24-Hr Dietary Recall First Meal: oatmeal or banana Snack:  Second Meal: tuna with saltine crackers, green beans Snack: banana or apple or veggie straws Third Meal: lean cuisine or green beans with salmon or baked chicken or hot dog and dried mangos or salmon with asparagus Snack: veggie straws or fruit Beverages:  water  Estimated Energy Needs Calories: 1500  NUTRITION DIAGNOSIS  Overweight/obesity ([place]-3.3) related to past poor dietary habits and physical inactivity as evidenced by patient w/ planned sleeve surgery following dietary guidelines for continued weight loss.  NUTRITION INTERVENTION  Nutrition counseling (C-1) and education (E-2) to facilitate bariatric surgery goals.  Encouraged pt to continue to eat balanced meals inclusive of non starchy vegetables 2 times a day 7 days a week Encouraged pt to choose lean protein sources: limiting beef, pork, sausage, hotdogs, and lunch meat Encouraged pt to continue to drink a minium 64 fluid ounces with half being plain water to satisfy proper hydration   Why you need complex carbohydrates: Whole grains and other complex carbohydrates are required to have a healthy diet. Whole grains provide fiber which can help with blood glucose levels and help keep you satiated. Fruits and starchy vegetables provide essential vitamins and minerals required for immune function, eyesight support, brain support, bone density, wound healing and many other functions within the body. According to the current evidenced based 2020-2025 Dietary Guidelines for Americans, complex carbohydrates are part of a healthy eating pattern which is associated with a decreased risk for type 2 diabetes, cancers, and cardiovascular disease.  Encouraged pt to meal plan/prep. Pack lunch for work. Encouraged patient to honor their body's internal hunger and fullness cues.  Throughout the day, check in mentally and rate hunger. Stop eating when satisfied not full regardless of how much food is left on the plate.  Get more if still hungry 20-30 minutes later.  The key is to honor satisfaction so throughout the meal, rate fullness factor and stop when comfortably satisfied not physically full. The key is to honor hunger and fullness without any feelings of guilt or shame.  Pay attention to what the  internal cues are, rather than any external factors. This will enhance the confidence you have in listening to your own body and following those internal cues enabling you to increase how often you eat when you are hungry not out of appetite and stop when you are satisfied not full.  Pre-Op Goals Progress & New Goals Continue: physical activity walking the dog daily for 60+ minutes. Continue: Eating every 3-5 Continue: have a protein choice with every meal or snack; track your protein, aim for 60 grams a day. Continue: practice not drinking with meals or snacks. Continue: aim at least 2 servings a day of non-starchy vegetables. 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  Continue: decrease portion sizes, slow down when eating (aim for 30 minutes); aim for satisfaction before fullness; get rid of distractions. New: avoid caffeine; start by switching to hot tea instead of coffee, and then drink decaf coffee or cut out.  Handouts Provided Include    Learning Style & Readiness for Change Teaching method utilized: Visual & Auditory  Demonstrated degree of understanding via: Teach Back  Readiness Level: preparation Barriers to learning/adherence to lifestyle change: nothing identified  RD's Notes for next Visit  Patient progress toward chosen goals.  MONITORING & EVALUATION Dietary intake, weekly physical activity, body weight, and pre-op goals in 1 month.   Next Steps  Patient is to return to [facility] in one month for next SWL visit.

## 2023-02-06 ENCOUNTER — Encounter: Payer: Self-pay | Admitting: Dietician

## 2023-02-06 ENCOUNTER — Encounter: Payer: 59 | Attending: General Surgery | Admitting: Dietician

## 2023-02-06 VITALS — Ht 70.0 in | Wt 371.3 lb

## 2023-02-06 DIAGNOSIS — Z713 Dietary counseling and surveillance: Secondary | ICD-10-CM | POA: Diagnosis not present

## 2023-02-06 DIAGNOSIS — Z6841 Body Mass Index (BMI) 40.0 and over, adult: Secondary | ICD-10-CM | POA: Insufficient documentation

## 2023-02-06 DIAGNOSIS — E669 Obesity, unspecified: Secondary | ICD-10-CM | POA: Diagnosis present

## 2023-02-06 NOTE — Progress Notes (Signed)
 Supervised Weight Loss Visit Bariatric Nutrition Education Appt Start Time:   [phone]  End Time: 0902  Planned surgery: Sleeve Gastrectomy  Pt expectation of surgery: jump start weight loss  Referral stated Supervised Weight Loss (SWL) visits needed: 6 Months  Pt completed visits.   Pt has cleared nutrition requirements.   6 out of 6 SWL Appointments   NUTRITION ASSESSMENT   Anthropometrics  Start weight at [facility]: 359.4 lbs (date: 09/02/2022) Height: 70 in Weight today: 371.3 lb BMI: 53.28 kg/m2     Clinical   Medical hx: asthma, HTN, hypercholesterolemia, obesity, prediabetes Medications: metformin  Labs: A1c 5.7; Vit D 11.4, glucose 116, Ca 10.3 Notable signs/symptoms: none noted Any previous deficiencies? 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 Pt states she will be able meet her goals in this new role. Pt states she has been meal prepping well. Pt states she has also done well with her fluids not being sodas (monster drinks) and other caffeine drinks like coffee. Pt states she is also doing well not eating out. Pt states she still needs to focus on avoiding fluids during meals, and taking smaller bites. Pt states she is trying to prep her meals, by cutting up her food a head of time, to help take smaller bites. Pt states she has been walking more with her dog when she takes him out to relieve himself. Pt states she found out from her mom that others in her family have had a history of vit D deficiency. Pt states she is still taking vit D.  Estimated daily fluid intake: 64-126 oz Supplements: Vit D Current average weekly physical activity: walking with Dad daily and dog daily, 60 min; pt states her dog likes to walk fast or run, stating her intensity is fast when she is walking the dog.  24-Hr Dietary Recall First Meal: oatmeal or banana Snack:  Second Meal: tuna with  saltine crackers, green beans Snack: banana or apple or veggie straws Third Meal: lean cuisine or green beans with salmon or baked chicken or hot dog and dried mangos or salmon with asparagus Snack: veggie straws or fruit Beverages: water   Estimated Energy Needs Calories: 1500  NUTRITION DIAGNOSIS  Overweight/obesity ([place]-3.3) related to past poor dietary habits and physical inactivity as evidenced by patient w/ planned sleeve surgery following dietary guidelines for continued weight loss.  NUTRITION INTERVENTION  Nutrition counseling (C-1) and education (E-2) to facilitate bariatric surgery goals.  Encouraged pt to continue to eat balanced meals inclusive of non starchy vegetables 2 times a day 7 days a week Encouraged pt to choose lean protein sources: limiting beef, pork, sausage, hotdogs, and lunch meat Encouraged pt to continue to drink a minium 64 fluid ounces with half being plain water  to satisfy proper hydration   Why you need complex carbohydrates: Whole grains and other complex carbohydrates are required to have a healthy diet. Whole grains provide fiber which can help with blood glucose levels and help keep you satiated. Fruits and starchy vegetables provide essential vitamins and minerals required for immune function, eyesight support, brain support, bone density, wound healing and many other functions within the body. According to the current evidenced based 2020-2025 Dietary Guidelines for Americans, complex carbohydrates are part of a healthy eating pattern which is associated with a decreased risk for type 2 diabetes, cancers, and cardiovascular disease.  Encouraged pt to meal  plan/prep. Pack lunch for work. Encouraged patient to honor their body's internal hunger and fullness cues.  Throughout the day, check in mentally and rate hunger. Stop eating when satisfied not full regardless of how much food is left on the plate.  Get more if still hungry 20-30 minutes later.  The key is  to honor satisfaction so throughout the meal, rate fullness factor and stop when comfortably satisfied not physically full. The key is to honor hunger and fullness without any feelings of guilt or shame.  Pay attention to what the internal cues are, rather than any external factors. This will enhance the confidence you have in listening to your own body and following those internal cues enabling you to increase how often you eat when you are hungry not out of appetite and stop when you are satisfied not full.  Pre-Op Goals Progress & New Goals Continue: physical activity walking the dog daily for 60+ minutes. Continue: Eating every 3-5 Continue: have a protein choice with every meal or snack; track your protein, aim for 60 grams a day. Continue: practice not drinking with meals or snacks. Continue: aim at least 2 servings a day of non-starchy vegetables. 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  Continue: decrease portion sizes, slow down when eating (aim for 30 minutes); aim for satisfaction before fullness; get rid of distractions. Continue: avoid caffeine; start by switching to hot tea instead of coffee, and then drink decaf coffee or cut out.  Handouts Provided Include    Learning Style & Readiness for Change Teaching method utilized: Visual & Auditory  Demonstrated degree of understanding via: Teach Back  Readiness Level: preparation Barriers to learning/adherence to lifestyle change: nothing identified  RD's Notes for next Visit  Patient progress toward chosen goals.  MONITORING & EVALUATION Dietary intake, weekly physical activity, body weight, and pre-op goals in 1 month.   Next Steps  Pt has completed visits. No further supervised visits required/recommended. Patient is to return to [facility] for pre-op class >2 weeks prior to scheduled surgery.

## 2023-02-13 ENCOUNTER — Ambulatory Visit: Payer: Medicare HMO | Admitting: Dietician

## 2023-02-24 ENCOUNTER — Ambulatory Visit: Payer: Medicare HMO

## 2023-03-03 ENCOUNTER — Encounter: Payer: 59 | Attending: Surgery | Admitting: Skilled Nursing Facility1

## 2023-03-03 VITALS — Wt 379.2 lb

## 2023-03-03 DIAGNOSIS — Z713 Dietary counseling and surveillance: Secondary | ICD-10-CM | POA: Insufficient documentation

## 2023-03-03 DIAGNOSIS — E669 Obesity, unspecified: Secondary | ICD-10-CM | POA: Insufficient documentation

## 2023-03-05 ENCOUNTER — Encounter: Payer: Self-pay | Admitting: Skilled Nursing Facility1

## 2023-03-05 NOTE — Progress Notes (Signed)
Pre-Operative Nutrition Class:    Patient was seen on 03/03/2023 for Pre-Operative Bariatric Surgery Education at [facility].    Anthropometrics  Start weight at [facility]: 359.4 lbs (date: 09/02/2022) Height: 70 in Weight today: 379.2   Clinical   Medical hx: asthma, HTN, hypercholesterolemia, obesity, prediabetes Medications: metformin  Labs: A1c 5.7; Vit D 11.4, glucose 116, Ca 10.3 Notable signs/symptoms: none noted Any previous deficiencies? No  Samples given per MNT protocol. Patient educated on appropriate usage:  [facility] [facility] Multivitamin Lot # [id number] Exp: 07/2024   Celebrate Vitamins Calcium  Lot # [id number] Exp: 08/2023   Ensure Max Protein Shake Lot # [id number] Exp: 11/05/2023  The following the learning objectives were met by the patient during this course: Identify Pre-Op Dietary Goals and will begin 2 weeks pre-operatively Identify appropriate sources of fluids and proteins  State protein recommendations and appropriate sources pre and post-operatively Identify Post-Operative Dietary Goals and will follow for 2 weeks post-operatively Identify appropriate multivitamin and calcium sources Describe the need for physical activity post-operatively and will follow MD recommendations State when to call healthcare provider regarding medication questions or post-operative complications When having a diagnosis of diabetes understanding hypoglycemia symptoms and the inclusion of 1 complex carbohydrate per meal  Handouts given during class include: Pre-Op Bariatric Surgery Diet Handout Protein Shake Handout Post-Op Bariatric Surgery Nutrition Handout BEL[facility] Program Information Flyer Support Group Information Flyer [facility] Bariatric Supplements Price List  Follow-Up Plan: Patient will follow-up at [facility] 2 weeks post operatively for diet advancement per MD.

## 2023-03-06 ENCOUNTER — Ambulatory Visit: Payer: Self-pay | Admitting: Surgery
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
## 2023-03-18 NOTE — Patient Instructions (Addendum)
SURGICAL WAITING ROOM VISITATION  Patients having surgery or a procedure may have no more than 2 support people in the waiting area - these visitors may rotate.    Children under the age of 72 must have an adult with them who is not the patient.  Due to an increase in RSV and influenza rates and associated hospitalizations, children ages 79 and under may not visit patients in [facility] hospitals.  Visitors with respiratory illnesses are discouraged from visiting and should remain at home.  If the patient needs to stay at the hospital during part of their recovery, the visitor guidelines for inpatient rooms apply. Pre-op nurse will coordinate an appropriate time for 1 support person to accompany patient in pre-op.  This support person may not rotate.    Please refer to the [facility] website for the visitor guidelines for Inpatients (after your surgery is over and you are in a regular room).       Your procedure is scheduled on: 04-01-23   Report to [facility] Main Entrance    Report to admitting at      0515 AM   Call this number if you have problems the morning of surgery [phone]   MORNING OF SURGERY DRINK:   DRINK 1 G2 drink BEFORE YOU LEAVE HOME, DRINK ALL OF THE  G2 DRINK AT ONE TIME.   NO SOLID FOOD AFTER 600 PM THE NIGHT BEFORE YOUR SURGERY. YOU MAY DRINK CLEAR FLUIDS.   THE G2 DRINK YOU DRINK BEFORE YOU LEAVE HOME WILL BE THE LAST FLUIDS YOU DRINK BEFORE SURGERY.  PAIN IS EXPECTED AFTER SURGERY AND WILL NOT BE COMPLETELY ELIMINATED. AMBULATION AND TYLENOL WILL HELP REDUCE INCISIONAL AND GAS PAIN. MOVEMENT IS KEY!  YOU ARE EXPECTED TO BE OUT OF BED WITHIN 4 HOURS OF ADMISSION TO YOUR PATIENT ROOM.  SITTING IN THE RECLINER THROUGHOUT THE DAY IS IMPORTANT FOR DRINKING FLUIDS AND MOVING GAS THROUGHOUT THE GI TRACT.  COMPRESSION STOCKINGS SHOULD BE WORN [facility] STAY UNLESS YOU ARE WALKING.   INCENTIVE SPIROMETER SHOULD BE USED EVERY HOUR WHILE  AWAKE TO DECREASE POST-OPERATIVE COMPLICATIONS SUCH AS PNEUMONIA.  WHEN DISCHARGED HOME, IT IS IMPORTANT TO CONTINUE TO WALK EVERY HOUR AND USE THE INCENTIVE SPIROMETER EVERY HOUR.     Water Non-Citrus Juices (without pulp, NO RED-Apple, White grape, White cranberry) Black Coffee (NO MILK/CREAM OR CREAMERS, sugar ok)  Clear Tea (NO MILK/CREAM OR CREAMERS, sugar ok) regular and decaf                             Plain Jell-O (NO RED)                                           Fruit ices (not with fruit pulp, NO RED)                                     Popsicles (NO RED)  Sports drinks like Gatorade (NO RED)                           If you have questions, please contact your surgeon's office.   FOLLOW  ANY ADDITIONAL PRE OP INSTRUCTIONS YOU RECEIVED FROM YOUR SURGEON'S OFFICE!!!     Oral Hygiene is also important to reduce your risk of infection.                                    Remember - BRUSH YOUR TEETH THE MORNING OF SURGERY WITH YOUR REGULAR TOOTHPASTE  DENTURES WILL BE REMOVED PRIOR TO SURGERY PLEASE DO NOT APPLY "Poly grip" OR ADHESIVES!!!   Do NOT smoke after Midnight   Stop all vitamins and herbal supplements 7 days before surgery.   Take these medicines the morning of surgery with A SIP OF WATER: Inhalers and bring rescue inhaler with you, Flonase if needed, metoprolol,  DO NOT TAKE ANY ORAL DIABETIC MEDICATIONS DAY OF YOUR SURGERY                                You may not have any metal on your body including hair pins, jewelry, and body piercing             Do not wear make-up, lotions, powders, perfumes/cologne, or deodorant  Do not wear nail polish including gel and S&S, artificial/acrylic nails, or any other type of covering on natural nails including finger and toenails. If you have artificial nails, gel coating, etc. that needs to be removed by a nail salon please have this removed prior to  surgery or surgery may need to be canceled/ delayed if the surgeon/ anesthesia feels like they are unable to be safely monitored.   Do not shave  48 hours prior to surgery.              Do not bring valuables to the hospital. [facility] IS NOT             RESPONSIBLE   FOR VALUABLES.   Contacts, glasses, dentures or bridgework may not be worn into surgery.   Bring small overnight bag day of surgery.   DO NOT BRING YOUR HOME MEDICATIONS TO THE HOSPITAL. PHARMACY WILL DISPENSE MEDICATIONS LISTED ON YOUR MEDICATION LIST TO YOU DURING YOUR ADMISSION IN THE HOSPITAL!     Special Instructions: Bring a copy of your healthcare power of attorney and living will documents the day of surgery if you haven't scanned them before.              Please read over the following fact sheets you were given: IF YOU HAVE QUESTIONS ABOUT YOUR PRE-OP INSTRUCTIONS PLEASE CALL [phone]    If you test positive for Covid or have been in contact with anyone that has tested positive in the last 10 days please notify you surgeon.    [facility] - Preparing for Surgery Before surgery, you can play an important role.  Because skin is not sterile, your skin needs to be as free of germs as possible.  You can reduce the number of germs on your skin by washing with CHG (chlorahexidine gluconate) soap before surgery.  CHG is an antiseptic cleaner which kills germs and bonds with the skin to continue killing germs even after washing. Please  DO NOT use if you have an allergy to CHG or antibacterial soaps.  If your skin becomes reddened/irritated stop using the CHG and inform your nurse when you arrive at Short Stay. Do not shave (including legs and underarms) for at least 48 hours prior to the first CHG shower.  You may shave your face/neck. Please follow these instructions carefully:  1.  Shower with CHG Soap the night before surgery and the  morning of Surgery.  2.  If you choose to wash your hair, wash your hair first  as usual with your  normal  shampoo.  3.  After you shampoo, rinse your hair and body thoroughly to remove the  shampoo.                           4.  Use CHG as you would any other liquid soap.  You can apply chg directly  to the skin and wash                       Gently with a scrungie or clean washcloth.  5.  Apply the CHG Soap to your body ONLY FROM THE NECK DOWN.   Do not use on face/ open                           Wound or open sores. Avoid contact with eyes, ears mouth and genitals (private parts).                       Wash face,  Genitals (private parts) with your normal soap.             6.  Wash thoroughly, paying special attention to the area where your surgery  will be performed.  7.  Thoroughly rinse your body with warm water from the neck down.  8.  DO NOT shower/wash with your normal soap after using and rinsing off  the CHG Soap.                9.  Pat yourself dry with a clean towel.            10.  Wear clean pajamas.            11.  Place clean sheets on your bed the night of your first shower and do not  sleep with pets. Day of Surgery : Do not apply any lotions/deodorants the morning of surgery.  Please wear clean clothes to the hospital/surgery center.  FAILURE TO FOLLOW THESE INSTRUCTIONS MAY RESULT IN THE CANCELLATION OF YOUR SURGERY PATIENT SIGNATURE_________________________________  NURSE SIGNATURE__________________________________  ________________________________________________________________________ WHAT IS A BLOOD TRANSFUSION? Blood Transfusion Information  A transfusion is the replacement of blood or some of its parts. Blood is made up of multiple cells which provide different functions. Red blood cells carry oxygen and are used for blood loss replacement. White blood cells fight against infection. Platelets control bleeding. Plasma helps clot blood. Other blood products are available for specialized needs, such as hemophilia or other clotting  disorders. BEFORE THE TRANSFUSION  Who gives blood for transfusions?  Healthy volunteers who are fully evaluated to make sure their blood is safe. This is blood bank blood. Transfusion therapy is the safest it has ever been in the practice of medicine. Before blood is taken from a donor, a complete history is taken to make sure that  person has no history of diseases nor engages in risky social behavior (examples are intravenous drug use or sexual activity with multiple partners). The donor's travel history is screened to minimize risk of transmitting infections, such as malaria. The donated blood is tested for signs of infectious diseases, such as HIV and hepatitis. The blood is then tested to be sure it is compatible with you in order to minimize the chance of a transfusion reaction. If you or a relative donates blood, this is often done in anticipation of surgery and is not appropriate for emergency situations. It takes many days to process the donated blood. RISKS AND COMPLICATIONS Although transfusion therapy is very safe and saves many lives, the main dangers of transfusion include:  Getting an infectious disease. Developing a transfusion reaction. This is an allergic reaction to something in the blood you were given. Every precaution is taken to prevent this. The decision to have a blood transfusion has been considered carefully by your caregiver before blood is given. Blood is not given unless the benefits outweigh the risks. AFTER THE TRANSFUSION Right after receiving a blood transfusion, you will usually feel much better and more energetic. This is especially true if your red blood cells have gotten low (anemic). The transfusion raises the level of the red blood cells which carry oxygen, and this usually causes an energy increase. The nurse administering the transfusion will monitor you carefully for complications. HOME CARE INSTRUCTIONS  No special instructions are needed after a transfusion.  You may find your energy is better. Speak with your caregiver about any limitations on activity for underlying diseases you may have. SEEK MEDICAL CARE IF:  Your condition is not improving after your transfusion. You develop redness or irritation at the intravenous (IV) site. SEEK IMMEDIATE MEDICAL CARE IF:  Any of the following symptoms occur over the next 12 hours: Shaking chills. You have a temperature by mouth above 102 F (38.9 C), not controlled by medicine. Chest, back, or muscle pain. People around you feel you are not acting correctly or are confused. Shortness of breath or difficulty breathing. Dizziness and fainting. You get a rash or develop hives. You have a decrease in urine output. Your urine turns a dark color or changes to pink, red, or brown. Any of the following symptoms occur over the next 10 days: You have a temperature by mouth above 102 F (38.9 C), not controlled by medicine. Shortness of breath. Weakness after normal activity. The white part of the eye turns yellow (jaundice). You have a decrease in the amount of urine or are urinating less often. Your urine turns a dark color or changes to pink, red, or brown. Document Released: 01/19/2000 Document Revised: 04/15/2011 Document Reviewed: 09/07/2007 [employer] Patient Information 2014 [place], [place].  _______________________________________________________________________  Incentive Spirometer  An incentive spirometer is a tool that can help keep your lungs clear and active. This tool measures how well you are filling your lungs with each breath. Taking long deep breaths may help reverse or decrease the chance of developing breathing (pulmonary) problems (especially infection) following: A long period of time when you are unable to move or be active. BEFORE THE PROCEDURE  If the spirometer includes an indicator to show your best effort, your nurse or respiratory therapist will set it to a desired goal. If  possible, sit up straight or lean slightly forward. Try not to slouch. Hold the incentive spirometer in an upright position. INSTRUCTIONS FOR USE  Sit on the edge of your  bed if possible, or sit up as far as you can in bed or on a chair. Hold the incentive spirometer in an upright position. Breathe out normally. Place the mouthpiece in your mouth and seal your lips tightly around it. Breathe in slowly and as deeply as possible, raising the piston or the ball toward the top of the column. Hold your breath for 3-5 seconds or for as long as possible. Allow the piston or ball to fall to the bottom of the column. Remove the mouthpiece from your mouth and breathe out normally. Rest for a few seconds and repeat Steps 1 through 7 at least 10 times every 1-2 hours when you are awake. Take your time and take a few normal breaths between deep breaths. The spirometer may include an indicator to show your best effort. Use the indicator as a goal to work toward during each repetition. After each set of 10 deep breaths, practice coughing to be sure your lungs are clear. If you have an incision (the cut made at the time of surgery), support your incision when coughing by placing a pillow or rolled up towels firmly against it. Once you are able to get out of bed, walk around indoors and cough well. You may stop using the incentive spirometer when instructed by your caregiver.  RISKS AND COMPLICATIONS Take your time so you do not get dizzy or light-headed. If you are in pain, you may need to take or ask for pain medication before doing incentive spirometry. It is harder to take a deep breath if you are having pain. AFTER USE Rest and breathe slowly and easily. It can be helpful to keep track of a log of your progress. Your caregiver can provide you with a simple table to help with this. If you are using the spirometer at home, follow these instructions: SEEK MEDICAL CARE IF:  You are having difficultly using the  spirometer. You have trouble using the spirometer as often as instructed. Your pain medication is not giving enough relief while using the spirometer. You develop fever of 100.5 F (38.1 C) or higher. SEEK IMMEDIATE MEDICAL CARE IF:  You cough up bloody sputum that had not been present before. You develop fever of 102 F (38.9 C) or greater. You develop worsening pain at or near the incision site. MAKE SURE YOU:  Understand these instructions. Will watch your condition. Will get help right away if you are not doing well or get worse. Document Released: 06/03/2006 Document Revised: 04/15/2011 Document Reviewed: 08/04/2006 [facility] Patient Information 2014 [place], [place].   ________________________________________________________________________

## 2023-03-18 NOTE — Progress Notes (Addendum)
Anesthesia Review:HTN, Hepatic steatosis, asthma  ZOX:WRUEAV Tiburcio Pea , MD  Cardiologist :no  Chest x-ray : 08/23/22- 2 view  EKG : 08/02/22  Echo : Stress test: Cardiac Cath :   Activity level: Able without Cp has SOB due to asthma  not new for pt.  Sleep Study/ CPAP :no  Fasting Blood Sugar :     / Checks Blood Sugar --0 times a day:    Blood Thinner/ Instructions /Last Dose: ASA / Instructions/ Last Dose :   DM- NO Hgba1c-  Metformin- none day of surgery   Takes For PCOS   03/11/23- In ED with Flu A Positive

## 2023-03-19 ENCOUNTER — Encounter (HOSPITAL_COMMUNITY): Payer: Self-pay

## 2023-03-19 ENCOUNTER — Other Ambulatory Visit: Payer: Self-pay

## 2023-03-19 ENCOUNTER — Encounter (HOSPITAL_COMMUNITY)
Admission: RE | Admit: 2023-03-19 | Discharge: 2023-03-19 | Disposition: A | Discharge status: Home / Self Care | Payer: 59 | Source: Ambulatory Visit | Attending: Surgery

## 2023-03-19 VITALS — BP 177/98 | HR 85 | Temp 98.1°F | Resp 16 | Ht 70.0 in | Wt 365.0 lb

## 2023-03-19 DIAGNOSIS — Z01812 Encounter for preprocedural laboratory examination: Secondary | ICD-10-CM | POA: Diagnosis present

## 2023-03-19 DIAGNOSIS — E119 Type 2 diabetes mellitus without complications: Secondary | ICD-10-CM | POA: Diagnosis not present

## 2023-03-19 HISTORY — DX: Polycystic ovarian syndrome: E28.2

## 2023-03-19 HISTORY — DX: Pneumonia, unspecified organism: J18.9

## 2023-03-19 LAB — COMPREHENSIVE METABOLIC PANEL
ALT: 51 U/L — ABNORMAL HIGH (ref 0–44)
AST: 35 U/L (ref 15–41)
Albumin: 3.8 g/dL (ref 3.5–5.0)
Alkaline Phosphatase: 54 U/L (ref 38–126)
Anion gap: 8 (ref 5–15)
BUN: 16 mg/dL (ref 6–20)
CO2: 26 mmol/L (ref 22–32)
Calcium: 9.8 mg/dL (ref 8.9–10.3)
Chloride: 104 mmol/L (ref 98–111)
Creatinine, Ser: 0.79 mg/dL (ref 0.44–1.00)
GFR, Estimated: >60 mL/min (ref >60)
Glucose, Bld: 110 mg/dL — ABNORMAL HIGH (ref 70–99)
Potassium: 4.3 mmol/L (ref 3.5–5.1)
Sodium: 138 mmol/L (ref 135–145)
Total Bilirubin: 0.7 mg/dL (ref 0.0–1.2)
Total Protein: 7.6 g/dL (ref 6.5–8.1)

## 2023-03-19 LAB — CBC WITH DIFFERENTIAL/PLATELET
Abs Immature Granulocytes: 0.06 10*3/uL (ref 0.00–0.07)
Basophils Absolute: 0.1 10*3/uL (ref 0.0–0.1)
Basophils Relative: 0 %
Eosinophils Absolute: 0.2 10*3/uL (ref 0.0–0.5)
Eosinophils Relative: 2 %
HCT: 41.9 % (ref 36.0–46.0)
Hemoglobin: 13.5 g/dL (ref 12.0–15.0)
Immature Granulocytes: 1 %
Lymphocytes Relative: 26 %
Lymphs Abs: 3.2 10*3/uL (ref 0.7–4.0)
MCH: 31.5 pg (ref 26.0–34.0)
MCHC: 32.2 g/dL (ref 30.0–36.0)
MCV: 97.7 fL (ref 80.0–100.0)
Monocytes Absolute: 0.7 10*3/uL (ref 0.1–1.0)
Monocytes Relative: 6 %
Neutro Abs: 7.9 10*3/uL — ABNORMAL HIGH (ref 1.7–7.7)
Neutrophils Relative %: 65 %
Platelets: 459 10*3/uL — ABNORMAL HIGH (ref 150–400)
RBC: 4.29 MIL/uL (ref 3.87–5.11)
RDW: 11.9 % (ref 11.5–15.5)
WBC: 12 10*3/uL — ABNORMAL HIGH (ref 4.0–10.5)
nRBC: 0 % (ref 0.0–0.2)

## 2023-03-19 LAB — TYPE AND SCREEN
ABO/RH(D): A POS
Antibody Screen: NEGATIVE

## 2023-04-01 ENCOUNTER — Encounter (HOSPITAL_COMMUNITY)
Admission: RE | Disposition: A | Discharge status: Home / Self Care | Payer: Self-pay | Source: Home / Self Care | Attending: Surgery

## 2023-04-01 ENCOUNTER — Other Ambulatory Visit: Payer: Self-pay

## 2023-04-01 ENCOUNTER — Inpatient Hospital Stay (HOSPITAL_COMMUNITY): Payer: 59 | Admitting: Certified Registered"

## 2023-04-01 ENCOUNTER — Inpatient Hospital Stay (HOSPITAL_COMMUNITY)
Admission: RE | Admit: 2023-04-01 | Discharge: 2023-04-02 | DRG: 621 | Disposition: A | Discharge status: Home / Self Care | Payer: 59 | Attending: Surgery | Admitting: Surgery

## 2023-04-01 ENCOUNTER — Encounter (HOSPITAL_COMMUNITY): Payer: Self-pay | Admitting: Surgery

## 2023-04-01 DIAGNOSIS — K760 Fatty (change of) liver, not elsewhere classified: Secondary | ICD-10-CM | POA: Diagnosis present

## 2023-04-01 DIAGNOSIS — F3170 Bipolar disorder, currently in remission, most recent episode unspecified: Secondary | ICD-10-CM | POA: Diagnosis present

## 2023-04-01 DIAGNOSIS — Z91030 Bee allergy status: Secondary | ICD-10-CM

## 2023-04-01 DIAGNOSIS — Z91040 Latex allergy status: Secondary | ICD-10-CM | POA: Diagnosis not present

## 2023-04-01 DIAGNOSIS — J45909 Unspecified asthma, uncomplicated: Secondary | ICD-10-CM | POA: Diagnosis present

## 2023-04-01 DIAGNOSIS — F419 Anxiety disorder, unspecified: Secondary | ICD-10-CM | POA: Diagnosis present

## 2023-04-01 DIAGNOSIS — Z79899 Other long term (current) drug therapy: Secondary | ICD-10-CM | POA: Diagnosis not present

## 2023-04-01 DIAGNOSIS — Z8249 Family history of ischemic heart disease and other diseases of the circulatory system: Secondary | ICD-10-CM

## 2023-04-01 DIAGNOSIS — I10 Essential (primary) hypertension: Secondary | ICD-10-CM | POA: Diagnosis present

## 2023-04-01 DIAGNOSIS — F4310 Post-traumatic stress disorder, unspecified: Secondary | ICD-10-CM | POA: Diagnosis present

## 2023-04-01 DIAGNOSIS — K5900 Constipation, unspecified: Secondary | ICD-10-CM | POA: Diagnosis present

## 2023-04-01 DIAGNOSIS — Z6841 Body Mass Index (BMI) 40.0 and over, adult: Secondary | ICD-10-CM

## 2023-04-01 DIAGNOSIS — E282 Polycystic ovarian syndrome: Secondary | ICD-10-CM | POA: Diagnosis present

## 2023-04-01 DIAGNOSIS — Z9109 Other allergy status, other than to drugs and biological substances: Secondary | ICD-10-CM

## 2023-04-01 DIAGNOSIS — Z833 Family history of diabetes mellitus: Secondary | ICD-10-CM | POA: Diagnosis not present

## 2023-04-01 DIAGNOSIS — G43909 Migraine, unspecified, not intractable, without status migrainosus: Secondary | ICD-10-CM | POA: Diagnosis present

## 2023-04-01 DIAGNOSIS — Z888 Allergy status to other drugs, medicaments and biological substances status: Secondary | ICD-10-CM | POA: Diagnosis not present

## 2023-04-01 DIAGNOSIS — Z7984 Long term (current) use of oral hypoglycemic drugs: Secondary | ICD-10-CM | POA: Diagnosis not present

## 2023-04-01 DIAGNOSIS — E119 Type 2 diabetes mellitus without complications: Secondary | ICD-10-CM | POA: Diagnosis present

## 2023-04-01 DIAGNOSIS — E559 Vitamin D deficiency, unspecified: Secondary | ICD-10-CM | POA: Diagnosis present

## 2023-04-01 HISTORY — PX: LAPAROSCOPIC GASTRIC SLEEVE RESECTION: SHX5895

## 2023-04-01 HISTORY — PX: UPPER GI ENDOSCOPY: SHX6162

## 2023-04-01 LAB — POCT PREGNANCY, URINE: Preg Test, Ur: NEGATIVE

## 2023-04-01 LAB — ABO/RH: ABO/RH(D): A POS

## 2023-04-01 SURGERY — GASTRECTOMY, SLEEVE, LAPAROSCOPIC
Anesthesia: General

## 2023-04-01 MED ORDER — APREPITANT 40 MG PO CAPS
40.0000 mg | ORAL_CAPSULE | ORAL | Status: AC
  Administered 2023-04-01: 40 mg via ORAL
  Filled 2023-04-01: qty 1

## 2023-04-01 MED ORDER — LACTATED RINGERS IV SOLN: INTRAVENOUS | Status: DC

## 2023-04-01 MED ORDER — METOPROLOL TARTRATE 50 MG PO TABS
100.0000 mg | ORAL_TABLET | Freq: Every day | ORAL | Status: DC
  Administered 2023-04-02: 100 mg via ORAL
  Filled 2023-04-01: qty 2

## 2023-04-01 MED ORDER — OXYCODONE HCL 5 MG PO TABS: 5.0000 mg | ORAL_TABLET | Freq: Once | ORAL | Status: DC | PRN

## 2023-04-01 MED ORDER — HYDRALAZINE HCL 20 MG/ML IJ SOLN
10.0000 mg | INTRAMUSCULAR | Status: DC | PRN
  Administered 2023-04-02: 10 mg via INTRAVENOUS

## 2023-04-01 MED ORDER — ORAL CARE MOUTH RINSE: 15.0000 mL | Freq: Once | OROMUCOSAL | Status: AC

## 2023-04-01 MED ORDER — BUPIVACAINE-EPINEPHRINE 0.25% -1:200000 IJ SOLN
INTRAMUSCULAR | Status: AC
  Filled 2023-04-01: qty 1

## 2023-04-01 MED ORDER — SUCCINYLCHOLINE CHLORIDE 200 MG/10ML IV SOSY
PREFILLED_SYRINGE | INTRAVENOUS | Status: DC | PRN
  Administered 2023-04-01: 200 mg via INTRAVENOUS

## 2023-04-01 MED ORDER — ALBUTEROL SULFATE (2.5 MG/3ML) 0.083% IN NEBU: 2.5000 mg | INHALATION_SOLUTION | Freq: Four times a day (QID) | RESPIRATORY_TRACT | Status: DC | PRN

## 2023-04-01 MED ORDER — FENTANYL CITRATE (PF) 100 MCG/2ML IJ SOLN
INTRAMUSCULAR | Status: DC | PRN
  Administered 2023-04-01: 100 ug via INTRAVENOUS

## 2023-04-01 MED ORDER — GABAPENTIN 100 MG PO CAPS
100.0000 mg | ORAL_CAPSULE | ORAL | Status: AC
  Administered 2023-04-01: 100 mg via ORAL
  Filled 2023-04-01: qty 1

## 2023-04-01 MED ORDER — DEXAMETHASONE SODIUM PHOSPHATE 10 MG/ML IJ SOLN
INTRAMUSCULAR | Status: AC
  Filled 2023-04-01: qty 1

## 2023-04-01 MED ORDER — ACETAMINOPHEN 160 MG/5ML PO SOLN: 1000.0000 mg | Freq: Three times a day (TID) | ORAL | Status: DC

## 2023-04-01 MED ORDER — SODIUM CHLORIDE 0.9 % IV SOLN
2.0000 g | INTRAVENOUS | Status: AC
  Administered 2023-04-01: 2 g via INTRAVENOUS
  Filled 2023-04-01: qty 2

## 2023-04-01 MED ORDER — MIDAZOLAM HCL 2 MG/2ML IJ SOLN
INTRAMUSCULAR | Status: DC | PRN
  Administered 2023-04-01: 2 mg via INTRAVENOUS

## 2023-04-01 MED ORDER — PANTOPRAZOLE SODIUM 40 MG IV SOLR
40.0000 mg | Freq: Every day | INTRAVENOUS | Status: DC
  Administered 2023-04-01: 40 mg via INTRAVENOUS
  Filled 2023-04-01: qty 10

## 2023-04-01 MED ORDER — FLUTICASONE FUROATE-VILANTEROL 200-25 MCG/ACT IN AEPB
1.0000 | INHALATION_SPRAY | Freq: Every day | RESPIRATORY_TRACT | Status: DC
  Administered 2023-04-02: 1 via RESPIRATORY_TRACT
  Filled 2023-04-01: qty 28

## 2023-04-01 MED ORDER — FENTANYL CITRATE PF 50 MCG/ML IJ SOSY
PREFILLED_SYRINGE | INTRAMUSCULAR | Status: AC
Start: 2023-04-01 — End: 2023-04-01
  Filled 2023-04-01: qty 1

## 2023-04-01 MED ORDER — METHOCARBAMOL 1000 MG/10ML IJ SOLN: 500.0000 mg | Freq: Four times a day (QID) | INTRAMUSCULAR | Status: DC | PRN

## 2023-04-01 MED ORDER — SIMETHICONE 80 MG PO CHEW
80.0000 mg | CHEWABLE_TABLET | Freq: Four times a day (QID) | ORAL | Status: DC | PRN
  Administered 2023-04-01 (×2): 80 mg via ORAL
  Filled 2023-04-01 (×4): qty 1

## 2023-04-01 MED ORDER — DOCUSATE SODIUM 100 MG PO CAPS
100.0000 mg | ORAL_CAPSULE | Freq: Two times a day (BID) | ORAL | Status: DC
  Administered 2023-04-01 – 2023-04-02 (×2): 100 mg via ORAL
  Filled 2023-04-01 (×2): qty 1

## 2023-04-01 MED ORDER — METOPROLOL TARTRATE 5 MG/5ML IV SOLN
5.0000 mg | Freq: Four times a day (QID) | INTRAVENOUS | Status: DC | PRN
  Administered 2023-04-01: 5 mg via INTRAVENOUS
  Filled 2023-04-01: qty 5

## 2023-04-01 MED ORDER — SUGAMMADEX SODIUM 200 MG/2ML IV SOLN
INTRAVENOUS | Status: DC | PRN
  Administered 2023-04-01: 400 mg via INTRAVENOUS

## 2023-04-01 MED ORDER — LACTATED RINGERS IR SOLN
Status: DC | PRN
  Administered 2023-04-01: 1000 mL

## 2023-04-01 MED ORDER — HEPARIN SODIUM (PORCINE) 5000 UNIT/ML IJ SOLN
5000.0000 [IU] | Freq: Three times a day (TID) | INTRAMUSCULAR | Status: DC
  Administered 2023-04-01 – 2023-04-02 (×2): 5000 [IU] via SUBCUTANEOUS
  Filled 2023-04-01 (×2): qty 1

## 2023-04-01 MED ORDER — CHLORHEXIDINE GLUCONATE 0.12 % MT SOLN
15.0000 mL | Freq: Once | OROMUCOSAL | Status: AC
  Administered 2023-04-01: 15 mL via OROMUCOSAL

## 2023-04-01 MED ORDER — SCOPOLAMINE 1 MG/3DAYS TD PT72
1.0000 | MEDICATED_PATCH | TRANSDERMAL | Status: DC
  Administered 2023-04-01: 1.5 mg via TRANSDERMAL
  Filled 2023-04-01: qty 1

## 2023-04-01 MED ORDER — ROCURONIUM BROMIDE 10 MG/ML (PF) SYRINGE
PREFILLED_SYRINGE | INTRAVENOUS | Status: AC
  Filled 2023-04-01: qty 10

## 2023-04-01 MED ORDER — PROPOFOL 10 MG/ML IV BOLUS
INTRAVENOUS | Status: AC
  Filled 2023-04-01: qty 20

## 2023-04-01 MED ORDER — OXYCODONE HCL 5 MG/5ML PO SOLN
5.0000 mg | Freq: Four times a day (QID) | ORAL | Status: DC | PRN
  Administered 2023-04-01 (×2): 5 mg via ORAL
  Filled 2023-04-01 (×4): qty 5

## 2023-04-01 MED ORDER — GABAPENTIN 100 MG PO CAPS
100.0000 mg | ORAL_CAPSULE | Freq: Two times a day (BID) | ORAL | Status: DC
  Administered 2023-04-01 – 2023-04-02 (×3): 100 mg via ORAL
  Filled 2023-04-01 (×3): qty 1

## 2023-04-01 MED ORDER — LIDOCAINE HCL (PF) 2 % IJ SOLN
INTRAMUSCULAR | Status: DC | PRN
  Administered 2023-04-01: 100 mg via INTRADERMAL

## 2023-04-01 MED ORDER — BUPIVACAINE-EPINEPHRINE 0.25% -1:200000 IJ SOLN
INTRAMUSCULAR | Status: DC | PRN
  Administered 2023-04-01: 60 mL

## 2023-04-01 MED ORDER — ROCURONIUM BROMIDE 10 MG/ML (PF) SYRINGE
PREFILLED_SYRINGE | INTRAVENOUS | Status: DC | PRN
  Administered 2023-04-01: 70 mg via INTRAVENOUS

## 2023-04-01 MED ORDER — OXYCODONE HCL 5 MG/5ML PO SOLN: 5.0000 mg | Freq: Once | ORAL | Status: DC | PRN

## 2023-04-01 MED ORDER — DEXMEDETOMIDINE HCL IN NACL 80 MCG/20ML IV SOLN
INTRAVENOUS | Status: DC | PRN
  Administered 2023-04-01: 4 ug via INTRAVENOUS

## 2023-04-01 MED ORDER — FENTANYL CITRATE (PF) 100 MCG/2ML IJ SOLN
INTRAMUSCULAR | Status: AC
Start: 2023-04-01 — End: ?
  Filled 2023-04-01: qty 2

## 2023-04-01 MED ORDER — ACETAMINOPHEN 500 MG PO TABS
1000.0000 mg | ORAL_TABLET | Freq: Three times a day (TID) | ORAL | Status: DC
Start: 2023-04-01 — End: 2023-04-02
  Administered 2023-04-01 (×2): 1000 mg via ORAL
  Filled 2023-04-01 (×3): qty 2

## 2023-04-01 MED ORDER — KETAMINE HCL 50 MG/5ML IJ SOSY
PREFILLED_SYRINGE | INTRAMUSCULAR | Status: DC | PRN
  Administered 2023-04-01 (×2): 10 mg via INTRAVENOUS

## 2023-04-01 MED ORDER — FENTANYL CITRATE PF 50 MCG/ML IJ SOSY
PREFILLED_SYRINGE | INTRAMUSCULAR | Status: AC
  Filled 2023-04-01: qty 2

## 2023-04-01 MED ORDER — TRAMADOL HCL 50 MG PO TABS: 50.0000 mg | ORAL_TABLET | Freq: Four times a day (QID) | ORAL | Status: DC | PRN

## 2023-04-01 MED ORDER — CHLORHEXIDINE GLUCONATE 4 % EX SOLN: 60.0000 mL | Freq: Once | CUTANEOUS | Status: DC

## 2023-04-01 MED ORDER — ONDANSETRON HCL 4 MG/2ML IJ SOLN
INTRAMUSCULAR | Status: DC | PRN
  Administered 2023-04-01: 4 mg via INTRAVENOUS

## 2023-04-01 MED ORDER — HYDROMORPHONE HCL 1 MG/ML IJ SOLN
INTRAMUSCULAR | Status: AC
  Filled 2023-04-01: qty 1

## 2023-04-01 MED ORDER — 0.9 % SODIUM CHLORIDE (POUR BTL) OPTIME
TOPICAL | Status: DC | PRN
  Administered 2023-04-01: 1000 mL

## 2023-04-01 MED ORDER — FENTANYL CITRATE PF 50 MCG/ML IJ SOSY
25.0000 ug | PREFILLED_SYRINGE | INTRAMUSCULAR | Status: DC | PRN
  Administered 2023-04-01 (×3): 50 ug via INTRAVENOUS

## 2023-04-01 MED ORDER — SUCCINYLCHOLINE CHLORIDE 200 MG/10ML IV SOSY
PREFILLED_SYRINGE | INTRAVENOUS | Status: AC
  Filled 2023-04-01: qty 10

## 2023-04-01 MED ORDER — STERILE WATER FOR IRRIGATION IR SOLN
Status: DC | PRN
  Administered 2023-04-01: 1000 mL

## 2023-04-01 MED ORDER — MIDAZOLAM HCL 2 MG/2ML IJ SOLN
INTRAMUSCULAR | Status: AC
  Filled 2023-04-01: qty 2

## 2023-04-01 MED ORDER — ENOXAPARIN (LOVENOX) PATIENT EDUCATION KIT
PACK | Freq: Once | Status: AC
  Filled 2023-04-01: qty 1

## 2023-04-01 MED ORDER — DEXAMETHASONE SODIUM PHOSPHATE 10 MG/ML IJ SOLN
INTRAMUSCULAR | Status: DC | PRN
  Administered 2023-04-01: 10 mg via INTRAVENOUS

## 2023-04-01 MED ORDER — SUMATRIPTAN SUCCINATE 25 MG PO TABS: 25.0000 mg | ORAL_TABLET | ORAL | Status: DC | PRN

## 2023-04-01 MED ORDER — PROPOFOL 10 MG/ML IV BOLUS
INTRAVENOUS | Status: DC | PRN
  Administered 2023-04-01: 200 mg via INTRAVENOUS

## 2023-04-01 MED ORDER — LIDOCAINE HCL (PF) 2 % IJ SOLN
INTRAMUSCULAR | Status: AC
  Filled 2023-04-01: qty 5

## 2023-04-01 MED ORDER — METOCLOPRAMIDE HCL 5 MG/ML IJ SOLN
10.0000 mg | Freq: Four times a day (QID) | INTRAMUSCULAR | Status: DC
  Administered 2023-04-01 – 2023-04-02 (×3): 10 mg via INTRAVENOUS
  Filled 2023-04-01 (×3): qty 2

## 2023-04-01 MED ORDER — KETAMINE HCL 50 MG/5ML IJ SOSY
PREFILLED_SYRINGE | INTRAMUSCULAR | Status: AC
  Filled 2023-04-01: qty 5

## 2023-04-01 MED ORDER — ONDANSETRON HCL 4 MG/2ML IJ SOLN
INTRAMUSCULAR | Status: AC
Start: 2023-04-01 — End: ?
  Filled 2023-04-01: qty 2

## 2023-04-01 MED ORDER — ONDANSETRON HCL 4 MG/2ML IJ SOLN
4.0000 mg | INTRAMUSCULAR | Status: DC | PRN
  Administered 2023-04-01: 4 mg via INTRAVENOUS
  Filled 2023-04-01: qty 2

## 2023-04-01 MED ORDER — ENSURE MAX PROTEIN PO LIQD
2.0000 [oz_av] | ORAL | Status: DC
  Administered 2023-04-02 (×2): 2 [oz_av] via ORAL

## 2023-04-01 MED ORDER — BUPIVACAINE LIPOSOME 1.3 % IJ SUSP: 20.0000 mL | Freq: Once | INTRAMUSCULAR | Status: DC

## 2023-04-01 MED ORDER — AMISULPRIDE (ANTIEMETIC) 5 MG/2ML IV SOLN: 10.0000 mg | Freq: Once | INTRAVENOUS | Status: DC | PRN

## 2023-04-01 MED ORDER — ACETAMINOPHEN 500 MG PO TABS
1000.0000 mg | ORAL_TABLET | ORAL | Status: AC
  Administered 2023-04-01: 1000 mg via ORAL
  Filled 2023-04-01: qty 2

## 2023-04-01 MED ORDER — HYDROMORPHONE HCL 1 MG/ML IJ SOLN
0.5000 mg | INTRAMUSCULAR | Status: DC | PRN
  Administered 2023-04-01 (×2): 0.5 mg via INTRAVENOUS
  Filled 2023-04-01: qty 0.5

## 2023-04-01 MED ORDER — HEPARIN SODIUM (PORCINE) 5000 UNIT/ML IJ SOLN
5000.0000 [IU] | INTRAMUSCULAR | Status: AC
  Administered 2023-04-01: 5000 [IU] via SUBCUTANEOUS
  Filled 2023-04-01: qty 1

## 2023-04-01 MED ORDER — SODIUM CHLORIDE 0.9 % IV SOLN: INTRAVENOUS | Status: DC

## 2023-04-01 SURGICAL SUPPLY — 62 items
ANTIFOG SOL W/FOAM PAD STRL (MISCELLANEOUS) ×1 IMPLANT
APPLIER CLIP ROT 10 11.4 M/L (STAPLE) IMPLANT
APPLIER CLIP ROT 13.4 12 LRG (CLIP) ×1 IMPLANT
BAG COUNTER SPONGE SURGICOUNT (BAG) IMPLANT
BAG LAPAROSCOPIC 12 15 PORT 16 (BASKET) IMPLANT
BAG RETRIEVAL 12/15 (BASKET) IMPLANT
BENZOIN TINCTURE PRP APPL 2/3 (GAUZE/BANDAGES/DRESSINGS) ×1 IMPLANT
BLADE SURG SZ11 CARB STEEL (BLADE) ×1 IMPLANT
BNDG ADH 1X3 SHEER STRL LF (GAUZE/BANDAGES/DRESSINGS) ×6 IMPLANT
CHLORAPREP W/TINT 26 (MISCELLANEOUS) ×2 IMPLANT
CLIP APPLIE ROT 10 11.4 M/L (STAPLE) IMPLANT
CLIP APPLIE ROT 13.4 12 LRG (CLIP) IMPLANT
COVER SURGICAL LIGHT HANDLE (MISCELLANEOUS) ×1 IMPLANT
DEVICE SUT QUICK LOAD TK 5 (SUTURE) IMPLANT
DEVICE SUT TI-KNOT TK 5X26 (SUTURE) IMPLANT
DRAPE UTILITY XL STRL (DRAPES) ×2 IMPLANT
ELECT REM PT RETURN 15FT ADLT (MISCELLANEOUS) ×1 IMPLANT
GAUZE SPONGE 4X4 12PLY STRL (GAUZE/BANDAGES/DRESSINGS) IMPLANT
GLOVE BIO SURGEON STRL SZ 6 (GLOVE) ×1 IMPLANT
GLOVE INDICATOR 6.5 STRL GRN (GLOVE) ×1 IMPLANT
GOWN STRL REUS W/ TWL LRG LVL3 (GOWN DISPOSABLE) ×1 IMPLANT
GRASPER SUT TROCAR 14GX15 (MISCELLANEOUS) ×1 IMPLANT
IRRIG SUCT STRYKERFLOW 2 WTIP (MISCELLANEOUS) ×1 IMPLANT
IRRIGATION SUCT STRKRFLW 2 WTP (MISCELLANEOUS) ×1 IMPLANT
KIT BASIN OR (CUSTOM PROCEDURE TRAY) ×1 IMPLANT
KIT TURNOVER KIT A (KITS) IMPLANT
MARKER SKIN DUAL TIP RULER LAB (MISCELLANEOUS) ×1 IMPLANT
MAT PREVALON FULL STRYKER (MISCELLANEOUS) ×1 IMPLANT
NDL SPNL 22GX3.5 QUINCKE BK (NEEDLE) ×1 IMPLANT
NEEDLE SPNL 22GX3.5 QUINCKE BK (NEEDLE) ×1 IMPLANT
PACK UNIVERSAL I (CUSTOM PROCEDURE TRAY) ×1 IMPLANT
RELOAD ENDO STITCH (ENDOMECHANICALS) IMPLANT
RELOAD STAPLE 60 3.6 BLU REG (STAPLE) ×1 IMPLANT
RELOAD STAPLE 60 3.8 GOLD REG (STAPLE) ×1 IMPLANT
RELOAD STAPLE 60 4.1 GRN THCK (STAPLE) IMPLANT
RELOAD SUT TRIPLE-STITCH 2-0 (ENDOMECHANICALS) IMPLANT
SCISSORS LAP 5X45 EPIX DISP (ENDOMECHANICALS) ×1 IMPLANT
SET TUBE SMOKE EVAC HIGH FLOW (TUBING) ×1 IMPLANT
SHEARS HARMONIC 45 ACE (MISCELLANEOUS) ×1 IMPLANT
SLEEVE ADV FIXATION 5X100MM (TROCAR) ×2 IMPLANT
SLEEVE GASTRECTOMY 40FR VISIGI (MISCELLANEOUS) ×1 IMPLANT
SOLUTION ANTFG W/FOAM PAD STRL (MISCELLANEOUS) ×1 IMPLANT
SPIKE FLUID TRANSFER (MISCELLANEOUS) ×1 IMPLANT
SPONGE T-LAP 18X18 ~~LOC~~+RFID (SPONGE) ×1 IMPLANT
STAPLE LINE REINFORCEMENT LAP (STAPLE) IMPLANT
STAPLER ECHELON LONG 3000 60 (ENDOMECHANICALS) IMPLANT
STAPLER ECHELON LONG 60 440 (INSTRUMENTS) ×1 IMPLANT
STAPLER RELOAD BLUE 60MM (STAPLE) ×4 IMPLANT
STAPLER RELOAD GOLD 60MM (STAPLE) ×1 IMPLANT
STAPLER RELOAD GREEN 60MM (STAPLE) IMPLANT
STRIP CLOSURE SKIN 1/2X4 (GAUZE/BANDAGES/DRESSINGS) ×1 IMPLANT
SUT MNCRL AB 4-0 PS2 18 (SUTURE) ×1 IMPLANT
SUT SURGIDAC NAB ES-9 0 48 120 (SUTURE) IMPLANT
SUT VICRYL 0 TIES 12 18 (SUTURE) ×1 IMPLANT
SYR 20ML LL LF (SYRINGE) ×1 IMPLANT
SYS KII OPTICAL ACCESS 15MM (TROCAR) ×1 IMPLANT
SYSTEM KII OPTICAL ACCESS 15MM (TROCAR) ×1 IMPLANT
TOWEL OR 17X26 10 PK STRL BLUE (TOWEL DISPOSABLE) ×1 IMPLANT
TROCAR ADV FIXATION 5X100MM (TROCAR) ×1 IMPLANT
TROCAR XCEL NON-BLD 5MMX100MML (ENDOMECHANICALS) ×1 IMPLANT
TUBING CONNECTING 10 (TUBING) ×1 IMPLANT
TUBING ENDO SMARTCAP (MISCELLANEOUS) ×1 IMPLANT

## 2023-04-01 NOTE — Anesthesia Postprocedure Evaluation (Signed)
 Anesthesia Post Note  Patient: Susan Butler  Procedure(s) Performed: LAPAROSCOPIC SLEEVE GASTRECTOMY UPPER GI ENDOSCOPY     Patient location during evaluation: PACU Anesthesia Type: General Level of consciousness: awake and alert Pain management: pain level controlled Vital Signs Assessment: post-procedure vital signs reviewed and stable Respiratory status: spontaneous breathing, nonlabored ventilation, respiratory function stable and patient connected to nasal cannula oxygen Cardiovascular status: blood pressure returned to baseline and stable Postop Assessment: no apparent nausea or vomiting Anesthetic complications: no   No notable events documented.  Last Vitals:  Vitals:   04/01/23 1015 04/01/23 1145  BP: (!) 155/93 (!) 176/95  Pulse: 80 93  Resp: 17 18  Temp: 36.4 C   SpO2: 94% 96%    Last Pain:  Vitals:   04/01/23 1236  TempSrc:   PainSc: 7                  Mariann Barter

## 2023-04-01 NOTE — Op Note (Signed)
 Operative Note  Susan Butler  [id number]  [id number]  04/01/2023   Surgeon: Phylliss Blakes MD FACS   Assistant: Ivar Drape MD FACS   Procedure performed: laparoscopic sleeve gastrectomy, upper endoscopy   Preop diagnosis: Morbid obesity Body mass index is 53.26 kg/m. Comorbidities include diabetes, hypertension, migraines, history of bipolar disorder and PTSD, anxiety, hepatic steatosis with elevated liver enzymes, PCOS, constipation and asthma  Post-op diagnosis/intraop findings: same   Specimens: fundus Retained items: none  EBL: minimal  Complications: none   Description of procedure: After confirming informed consent and administration of chemical DVT prophylaxis in holding, the patient was taken to the operating room and placed supine on operating room table where general endotracheal anesthesia was initiated, preoperative antibiotics were administered, SCDs applied, and a formal timeout was performed. The abdomen was prepped and draped in usual sterile fashion. Peritoneal access was gained using a Visiport technique in the left upper quadrant and insufflation to 15 mmHg ensued without issue. Gross inspection revealed no evidence of injury. Under direct visualization three more 5 mm trochars were placed in the right and left hemiabdomen and the 15mm trocar in the right paramedian upper abdomen. Bilateral laparoscopic assisted TAPS blocks were performed with 0.25 percent Marcaine with epinephrine. The patient was placed in steep reverseTrendelenburg and the liver retractor was introduced through an incision in the upper midline and secured to the post externally to maintain the left lobe retracted anteriorly.  There is no hiatal hernia on direct inspection. Using the Harmonic scalpel, the greater curvature of the stomach was dissected away from the greater omentum and short gastric vessels were divided. This began 6 cm from the pylorus, and dissection proceeded until the  left crus was clearly exposed. Esophageal fat pad was mobilized off the anterior stomach slightly. The 75 [place] VisiGi was then introduced and directed down towards the pylorus. This was placed to suction against the lesser curve. Serial fires of the linear cutting stapler were then employed to create our sleeve. The first fire used a gold load with staple line reinforcement and ensured adequate room at the angularis incisura. Several blue loads were then employed to create an evenly tubular stomach, preserving 1cm lateral to the angle of His. The excised stomach was then removed through our 15 mm trocar site within an Endo Catch bag.  The visigi was taken off of suction and a few puffs of air were introduced, inflating the sleeve. No bubbles were observed in the irrigation fluid around the stomach. The visigi was then removed. Upper endoscopy was performed by the assistant surgeon and the sleeve was noted to be airtight, the staple line was hemostatic, the lumen easily traversible to the pylorus. Please see his separate note. The endoscope was removed. A minimal amount of oozing on the most distal staple line was addressed with clips. The 15 mm trocar site fascia in the right upper abdomen was closed with a 0 Vicryl using the laparoscopic suture passer under direct visualization. The liver retractor was removed under direct visualization. The abdomen was then desufflated and all remaining trochars removed. The skin incisions were closed with subcuticular 4-0 Monocryl; benzoin, Steri-Strips and Band-Aids were applied The patient was then awakened, extubated and taken to PACU in stable condition.     All counts were correct at the completion of the case.

## 2023-04-01 NOTE — Progress Notes (Signed)

## 2023-04-01 NOTE — Anesthesia Preprocedure Evaluation (Addendum)
 Anesthesia Evaluation  Patient identified by MRN, date of birth, ID band Patient awake    Reviewed: Allergy & Precautions, NPO status , Patient's Chart, lab work & pertinent test results  Airway Mallampati: II  TM Distance: >3 FB Neck ROM: Full    Dental no notable dental hx.    Pulmonary asthma    Pulmonary exam normal        Cardiovascular hypertension, Pt. on home beta blockers Normal cardiovascular exam     Neuro/Psych  Headaches  negative psych ROS   GI/Hepatic negative GI ROS, Neg liver ROS,,,  Endo/Other    Class 4 obesityPCOS (polycystic ovarian syndrome)  Renal/GU negative Renal ROS     Musculoskeletal negative musculoskeletal ROS (+)    Abdominal  (+) + obese  Peds  Hematology negative hematology ROS (+)   Anesthesia Other Findings MORBID OBESITY  Reproductive/Obstetrics Hcg negative                             Anesthesia Physical Anesthesia Plan  ASA: 4  Anesthesia Plan: General   Post-op Pain Management:    Induction: Intravenous  PONV Risk Score and Plan: 4 or greater and Ondansetron, Dexamethasone, Midazolam, Scopolamine patch - Pre-op and Treatment may vary due to age or medical condition  Airway Management Planned: Oral ETT and Video Laryngoscope Planned  Additional Equipment:   Intra-op Plan:   Post-operative Plan: Extubation in OR  Informed Consent: I have reviewed the patients History and Physical, chart, labs and discussed the procedure including the risks, benefits and alternatives for the proposed anesthesia with the patient or authorized representative who has indicated his/her understanding and acceptance.     Dental advisory given  Plan Discussed with: CRNA  Anesthesia Plan Comments:        Anesthesia Quick Evaluation

## 2023-04-01 NOTE — Op Note (Signed)
   Patient: Susan Butler ([birth year], [id number])  Date of Surgery: 04/01/2023  Preoperative Diagnosis: MORBID OBESITY   Postoperative Diagnosis: MORBID OBESITY   Surgical Procedure: Upper Endoscopy   Surgeon: Ivar Drape, MD  Anesthesiologist: Mariann Barter, MD CRNA: Elisabeth Cara, CRNA; Sindy Guadeloupe, CRNA   Anesthesia: General   Fluids:  Total I/O In: 100 [IV Piggyback:100] Out: -   Complications: None  Drains:  None  Specimen: None   Indications for Procedure: Susan Butler is a 28 y.o. female undergoing sleeve gastrectomy and an EGD was requested to evaluate foregut anatomy intraoperatively.  Description of Procedure: During the procedure, I scrubbed out and obtained the Olympus endoscope. I gently placed endoscope in the patient's oropharynx and gently glided it down the esophagus without any difficulty under direct visualization.  The scope was advanced as far as the pylorus and then slowly withdrawn to inspect the foregut anatomy.  Dr. Fredricka Bonine had placed saline in the upper abdomen and all staple lines were submerged to ensure no air leak. There was no evidence of bubbles. There was no evidence of intraluminal bleeding and the mucosa appeared healthy.  The lumen was widely patent without evidence of stricture.  The intraluminal insufflation was decompressed. The scope was withdrawn. The patient tolerated this portion of the procedure well. Please see Dr Derrill Memo operative note for details regarding the remainder of the procedure.    Ivar Drape, MD General, Bariatric, & Minimally Invasive Surgery [facility], [place]

## 2023-04-01 NOTE — Interval H&P Note (Signed)
 History and Physical Interval Note:  04/01/2023 7:01 AM  Susan Butler  has presented today for surgery, with the diagnosis of MORBID OBESITY.  The various methods of treatment have been discussed with the patient and family. After consideration of risks, benefits and other options for treatment, the patient has consented to  Procedure(s): LAPAROSCOPIC SLEEVE GASTRECTOMY (N/A) UPPER GI ENDOSCOPY (N/A) as a surgical intervention.  The patient's history has been reviewed, patient examined, no change in status, stable for surgery.  I have reviewed the patient's chart and labs.  Questions were answered to the patient's satisfaction.     Keiri Solano Lollie Sails

## 2023-04-01 NOTE — Transfer of Care (Signed)
 Immediate Anesthesia Transfer of Care Note  Patient: Susan Butler  Procedure(s) Performed: LAPAROSCOPIC SLEEVE GASTRECTOMY UPPER GI ENDOSCOPY  Patient Location: PACU  Anesthesia Type:General  Level of Consciousness: awake, drowsy, and patient cooperative  Airway & Oxygen Therapy: Patient Spontanous Breathing and Patient connected to face mask oxygen  Post-op Assessment: Report given to RN and Post -op Vital signs reviewed and stable  Post vital signs: Reviewed and stable  Last Vitals:  Vitals Value Taken Time  BP 174/117 04/01/23 0902  Temp 36.1 C 04/01/23 0900  Pulse 74 04/01/23 0905  Resp 27 04/01/23 0905  SpO2 100 % 04/01/23 0905  Vitals shown include unfiled device data.  Last Pain:  Vitals:   04/01/23 0900  TempSrc:   PainSc: Asleep      Patients Stated Pain Goal: 4 (04/01/23 0556)  Complications: No notable events documented.

## 2023-04-01 NOTE — Anesthesia Procedure Notes (Addendum)
 Procedure Name: Intubation Date/Time: 04/01/2023 7:29 AM  Performed by: Sindy Guadeloupe, CRNAPre-anesthesia Checklist: Patient identified, Emergency Drugs available, Suction available, Patient being monitored and Timeout performed Patient Re-evaluated:Patient Re-evaluated prior to induction Oxygen Delivery Method: Circle system utilized Preoxygenation: Pre-oxygenation with 100% oxygen Induction Type: IV induction Ventilation: Mask ventilation without difficulty Laryngoscope Size: Glidescope and 3 Grade View: Grade I Tube type: Oral Tube size: 7.0 mm Number of attempts: 1 Airway Equipment and Method: Stylet and Video-laryngoscopy Placement Confirmation: ETT inserted through vocal cords under direct vision, positive ETCO2 and breath sounds checked- equal and bilateral Secured at: 22 cm Tube secured with: Tape Dental Injury: Teeth and Oropharynx as per pre-operative assessment

## 2023-04-01 NOTE — Plan of Care (Signed)
   Problem: Education: Goal: Knowledge of General Education information will improve Description: Including pain rating scale, medication(s)/side effects and non-pharmacologic comfort measures Outcome: Progressing   Problem: Activity: Goal: Risk for activity intolerance will decrease Outcome: Progressing

## 2023-04-01 NOTE — Progress Notes (Signed)
 PHARMACY CONSULT FOR:  Risk Assessment for Post-Discharge VTE Following Bariatric Surgery  Procedure* Sleeve gastrectomy  Sex Female  Black race No  Age (years) 27  BMI (kg/m2) 53  Operation duration (minutes) 57 minutes  History of VTE requiring treatment* No  Hypercoagulable condition* No  Liver disorder* Yes - history hepatic steatosis  Pre-op venous stasis No  Pre-op functional health status Independent  Previous foregut or bariatric surg No  Post-op surgical site infection N/A  Transfusion intra- or post-op* No  Unplanned readmission N/A  Unplanned reoperation N/A  GI perforation/leak/obstruction* No  *specific risk factors for portomesenteric venous thrombosis   Predicted probability of 30-day post-discharge VTE:    0.2689 % estimated using the [facility] Calculator  Other patient-specific factors to consider: hepatic steatosis  With sleeve gastrectomy and documentation of hepatic steatosis, patient meets 2 risk factors for portomesenteric vein thrombosis and automatically rules in for VTE prophylaxis at discharge    Recommendation for Discharge: Enoxaparin 60 mg [place] q12h x 30 days post-discharge  This order has been pended for discharge to be signed by discharging physician at their discretion   Susan Butler is a 28 y.o. female who underwent  sleeve gastrectomy on 04/01/23   Case start: 0748 Case end: 0845   Allergies  Allergen Reactions   Bee Venom Anaphylaxis   Latex Anaphylaxis   Lithium     Agitation    Seroquel [Quetiapine]     Agitation     Patient Measurements: Height: 5\' 10"  (177.8 cm) Weight: (!) 168.4 kg (371 lb 3.2 oz) IBW/kg (Calculated) : 68.5 Body mass index is 53.26 kg/m.  No results for input(s): "WBC", "HGB", "HCT", "PLT", "APTT", "CREATININE", "LABCREA", "CREAT24HRUR", "MG", "PHOS", "ALBUMIN", "PROT", "AST", "ALT", "ALKPHOS", "BILITOT", "BILIDIR", "IBILI" in the last 72 hours. Estimated  Creatinine Clearance: 184.1 mL/min (by C-G formula based on SCr of 0.79 mg/dL).    Past Medical History:  Diagnosis Date   Asthma    Hypertension    Migraines    PCOS (polycystic ovarian syndrome)    takes metformin for PCOS   Pneumonia      Medications Prior to Admission  Medication Sig Dispense Refill Last Dose/Taking   albuterol (VENTOLIN HFA) 108 (90 Base) MCG/ACT inhaler Inhale 2 puffs into the lungs every 6 (six) hours as needed for shortness of breath.   Past Month   benzonatate (TESSALON) 100 MG capsule Take 1 capsule (100 mg total) by mouth every 8 (eight) hours. (Patient taking differently: Take 100 mg by mouth every 8 (eight) hours as needed for cough.) 21 capsule 0 Past Month   fluticasone-salmeterol (ADVAIR) 250-50 MCG/ACT AEPB Inhale 1 puff into the lungs daily.   Past Week   metFORMIN (GLUCOPHAGE-XR) 500 MG 24 hr tablet Take 500 mg by mouth 2 (two) times daily.   03/31/2023   metoprolol tartrate (LOPRESSOR) 50 MG tablet Take 100 mg by mouth daily.   04/01/2023 at  1:00 AM   acetaminophen (TYLENOL) 500 MG tablet Take 1,000 mg by mouth every 6 (six) hours as needed for moderate pain (pain score 4-6).   More than a month   EPINEPHrine 0.3 mg/0.3 mL IJ SOAJ injection Inject 0.3 mg into the muscle as needed for anaphylaxis.   More than a month   fluticasone (FLONASE) 50 MCG/ACT nasal spray Place 1-2 sprays into both nostrils daily as needed for allergies.   More than a month   JENCYCLA 0.35 MG tablet Take 1 tablet by  mouth daily.   03/01/2023   nystatin cream (MYCOSTATIN) Apply 1 Application topically 2 (two) times daily as needed (eczema).   More than a month   SUMAtriptan (IMITREX) 25 MG tablet Take 25 mg by mouth every 2 (two) hours as needed for migraine.   More than a month     Pricilla Riffle, PharmD, BCPS Clinical Pharmacist 04/01/2023 2:48 PM

## 2023-04-01 NOTE — Discharge Instructions (Signed)

## 2023-04-02 ENCOUNTER — Other Ambulatory Visit (HOSPITAL_COMMUNITY): Payer: Self-pay

## 2023-04-02 ENCOUNTER — Encounter (HOSPITAL_COMMUNITY): Payer: Self-pay | Admitting: Surgery

## 2023-04-02 LAB — CBC
HCT: 36 % (ref 36.0–46.0)
Hemoglobin: 12 g/dL (ref 12.0–15.0)
MCH: 31.7 pg (ref 26.0–34.0)
MCHC: 33.3 g/dL (ref 30.0–36.0)
MCV: 95 fL (ref 80.0–100.0)
Platelets: 399 10*3/uL (ref 150–400)
RBC: 3.79 MIL/uL — ABNORMAL LOW (ref 3.87–5.11)
RDW: 12 % (ref 11.5–15.5)
WBC: 13.5 10*3/uL — ABNORMAL HIGH (ref 4.0–10.5)
nRBC: 0 % (ref 0.0–0.2)

## 2023-04-02 LAB — COMPREHENSIVE METABOLIC PANEL
ALT: 56 U/L — ABNORMAL HIGH (ref 0–44)
AST: 43 U/L — ABNORMAL HIGH (ref 15–41)
Albumin: 3.6 g/dL (ref 3.5–5.0)
Alkaline Phosphatase: 58 U/L (ref 38–126)
Anion gap: 9 (ref 5–15)
BUN: 11 mg/dL (ref 6–20)
CO2: 24 mmol/L (ref 22–32)
Calcium: 9 mg/dL (ref 8.9–10.3)
Chloride: 104 mmol/L (ref 98–111)
Creatinine, Ser: 0.7 mg/dL (ref 0.44–1.00)
GFR, Estimated: >60 mL/min (ref >60)
Glucose, Bld: 106 mg/dL — ABNORMAL HIGH (ref 70–99)
Potassium: 4.1 mmol/L (ref 3.5–5.1)
Sodium: 137 mmol/L (ref 135–145)
Total Bilirubin: 0.9 mg/dL (ref 0.0–1.2)
Total Protein: 7.1 g/dL (ref 6.5–8.1)

## 2023-04-02 LAB — MAGNESIUM: Magnesium: 2 mg/dL (ref 1.7–2.4)

## 2023-04-02 MED ORDER — ACETAMINOPHEN 500 MG PO TABS: 1000.0000 mg | ORAL_TABLET | Freq: Three times a day (TID) | ORAL | Status: AC

## 2023-04-02 MED ORDER — GABAPENTIN 100 MG PO CAPS
100.0000 mg | ORAL_CAPSULE | Freq: Two times a day (BID) | ORAL | 0 refills | Status: AC
  Filled 2023-04-02: qty 10, 5d supply, fill #0

## 2023-04-02 MED ORDER — TRAMADOL HCL 50 MG PO TABS
50.0000 mg | ORAL_TABLET | Freq: Four times a day (QID) | ORAL | 0 refills | Status: AC | PRN
  Filled 2023-04-02: qty 10, 3d supply, fill #0

## 2023-04-02 MED ORDER — ONDANSETRON 4 MG PO TBDP
4.0000 mg | ORAL_TABLET | Freq: Four times a day (QID) | ORAL | 0 refills | Status: DC | PRN
  Filled 2023-04-02: qty 20, 5d supply, fill #0

## 2023-04-02 MED ORDER — ENOXAPARIN SODIUM 60 MG/0.6ML IJ SOSY
60.0000 mg | PREFILLED_SYRINGE | Freq: Two times a day (BID) | INTRAMUSCULAR | 0 refills | Status: AC
  Filled 2023-04-02: qty 36, 30d supply, fill #0

## 2023-04-02 MED ORDER — PANTOPRAZOLE SODIUM 40 MG PO TBEC
40.0000 mg | DELAYED_RELEASE_TABLET | Freq: Every day | ORAL | 0 refills | Status: AC
  Filled 2023-04-02: qty 90, 90d supply, fill #0

## 2023-04-02 MED ORDER — DOCUSATE SODIUM 100 MG PO CAPS
100.0000 mg | ORAL_CAPSULE | Freq: Two times a day (BID) | ORAL | 0 refills | Status: AC
  Filled 2023-04-02: qty 30, 15d supply, fill #0

## 2023-04-02 NOTE — Progress Notes (Signed)
 Patient alert and oriented, pain is controlled. Patient is tolerating fluids, advanced to protein shake today, patient is tolerating well. Reviewed Gastric sleeve/bypass discharge instructions with patient and patient is able to articulate understanding. Provided information on BELT program, Support Group, BSTOP-D, and [facility] outpatient pharmacy. Communicated general update of patient status to surgeon. All questions answered. 24hr fluid recall is 990 mL per hydration protocol, bariatric nurse coordinator to make follow-up phone call within one week.

## 2023-04-02 NOTE — Progress Notes (Signed)
 Reviewed written d/c instructions w pt and all questions answered. Reviewed Lovenox shots and pt states that she is comfortable w giving herself shots. She verbalized understanding of above. D/C via w/c w all belongings in stable condition.

## 2023-04-02 NOTE — Plan of Care (Signed)
  Problem: Education: Goal: Knowledge of General Education information will improve Description: Including pain rating scale, medication(s)/side effects and non-pharmacologic comfort measures Outcome: Adequate for Discharge   Problem: Health Behavior/Discharge Planning: Goal: Ability to manage health-related needs will improve Outcome: Adequate for Discharge   Problem: Clinical Measurements: Goal: Ability to maintain clinical measurements within normal limits will improve Outcome: Adequate for Discharge Goal: Will remain free from infection Outcome: Adequate for Discharge Goal: Diagnostic test results will improve Outcome: Adequate for Discharge Goal: Respiratory complications will improve Outcome: Adequate for Discharge Goal: Cardiovascular complication will be avoided Outcome: Adequate for Discharge   Problem: Activity: Goal: Risk for activity intolerance will decrease Outcome: Adequate for Discharge   Problem: Nutrition: Goal: Adequate nutrition will be maintained Outcome: Adequate for Discharge   Problem: Coping: Goal: Level of anxiety will decrease Outcome: Adequate for Discharge   Problem: Elimination: Goal: Will not experience complications related to bowel motility Outcome: Adequate for Discharge Goal: Will not experience complications related to urinary retention Outcome: Adequate for Discharge   Problem: Pain Managment: Goal: General experience of comfort will improve and/or be controlled Outcome: Adequate for Discharge   Problem: Safety: Goal: Ability to remain free from injury will improve Outcome: Adequate for Discharge   Problem: Skin Integrity: Goal: Risk for impaired skin integrity will decrease Outcome: Adequate for Discharge   Problem: Education: Goal: Ability to state signs and symptoms to report to health care provider will improve Outcome: Adequate for Discharge Goal: Knowledge of the prescribed self-care regimen will improve Outcome:  Adequate for Discharge Goal: Knowledge of discharge needs will improve Outcome: Adequate for Discharge   Problem: Activity: Goal: Ability to tolerate increased activity will improve Outcome: Adequate for Discharge   Problem: Bowel/Gastric: Goal: Gastrointestinal status for postoperative course will improve Outcome: Adequate for Discharge Goal: Occurrences of nausea will decrease Outcome: Adequate for Discharge   Problem: Coping: Goal: Development of coping mechanisms to deal with changes in body function or appearance will improve Outcome: Adequate for Discharge   Problem: Fluid Volume: Goal: Maintenance of adequate hydration will improve Outcome: Adequate for Discharge   Problem: Nutritional: Goal: Nutritional status will improve Outcome: Adequate for Discharge   Problem: Clinical Measurements: Goal: Will show no signs or symptoms of venous thromboembolism Outcome: Adequate for Discharge Goal: Will remain free from infection Outcome: Adequate for Discharge Goal: Will show no signs of GI Leak Outcome: Adequate for Discharge   Problem: Respiratory: Goal: Will regain and/or maintain adequate ventilation Outcome: Adequate for Discharge   Problem: Pain Management: Goal: Pain level will decrease Outcome: Adequate for Discharge   Problem: Skin Integrity: Goal: Demonstration of wound healing without infection will improve Outcome: Adequate for Discharge

## 2023-04-02 NOTE — Progress Notes (Signed)
   04/02/23 1123  TOC Brief Assessment  Insurance and Status Reviewed  Patient has primary care physician Yes  Home environment has been reviewed resides in private residence  Prior level of function: Independent  Prior/Current Home Services No current home services  Social Drivers of Health Review SDOH reviewed no interventions necessary  Readmission risk has been reviewed Yes  Transition of care needs no transition of care needs at this time

## 2023-04-02 NOTE — Discharge Summary (Addendum)
 Physician Discharge Summary  Susan Butler IRJ:[id number] DOB: 1995-12-06 DOA: 04/01/2023  PCP: Cristino Martes, NP  Admit date: 04/01/2023 Discharge date: 04/02/2023   Recommendations for Outpatient Follow-up:    Follow-up Information     Berna Bue, MD Follow up.   Specialty: General Surgery Contact information: [address] [place] [address] [phone]                Discharge Diagnoses:  Principal Problem:   Morbid obesity (HCC)   Surgical Procedure: Laparoscopic Sleeve Gastrectomy, upper endoscopy  Discharge Condition: Good Disposition: Home  Diet recommendation: Postoperative sleeve gastrectomy diet (liquids only)  Filed Weights   04/01/23 0556  Weight: (!) 168.4 kg     Hospital Course:  The patient was admitted for a planned laparoscopic sleeve gastrectomy. Please see operative note. Preoperatively the patient was given 5000 units of subcutaneous heparin for DVT prophylaxis. Postoperative prophylactic heparin dosing was started on the evening of postoperative day 0. ERAS protocol was used. On the evening of postoperative day 0, the patient was started on water and ice chips. On postoperative day 1 the patient had no fever or tachycardia and was tolerating water in their diet was gradually advanced throughout the day. The patient was ambulating without difficulty. Their vital signs are stable without fever or tachycardia. Their hemoglobin had remained stable. The patient had received discharge instructions and    Vitals:   04/02/23 0140 04/02/23 0548  BP: (!) 174/93 (!) 159/97  Pulse: (!) 104   Resp: 18 16  Temp: 97.7 F (36.5 C) 98 F (36.7 C)  SpO2: 100% 96%   A&Ox3 Unlabored respirations Abd s/nt/nd, incisions c/d/I with steri strips/bandaids, no cellulitis or hematoma No LE edema  Discharge Instructions   Allergies as of 04/02/2023       Reactions   Bee Venom Anaphylaxis   Latex Anaphylaxis   Lithium     Agitation   Seroquel [quetiapine]    Agitation        Medication List     PAUSE taking these medications    Jencycla 0.35 MG tablet Wait to take this until: Jun 30, 2023 Generic drug: norethindrone Take 1 tablet by mouth daily.       STOP taking these medications    metFORMIN 500 MG 24 hr tablet Commonly known as: GLUCOPHAGE-XR       TAKE these medications    acetaminophen 500 MG tablet Commonly known as: TYLENOL Take 1,000 mg by mouth every 6 (six) hours as needed for moderate pain (pain score 4-6). What changed: Another medication with the same name was added. Make sure you understand how and when to take each.   acetaminophen 500 MG tablet Commonly known as: TYLENOL Take 2 tablets (1,000 mg total) by mouth every 8 (eight) hours for 5 days. What changed: You were already taking a medication with the same name, and this prescription was added. Make sure you understand how and when to take each.   albuterol 108 (90 Base) MCG/ACT inhaler Commonly known as: VENTOLIN HFA Inhale 2 puffs into the lungs every 6 (six) hours as needed for shortness of breath.   benzonatate 100 MG capsule Commonly known as: TESSALON Take 1 capsule (100 mg total) by mouth every 8 (eight) hours. What changed:  when to take this reasons to take this   docusate sodium 100 MG capsule Commonly known as: Colace Take 1 capsule (100 mg total) by mouth 2 (two) times daily. Okay to  decrease to once daily or stop taking if having loose bowel movements   enoxaparin 60 MG/0.6ML injection Commonly known as: LOVENOX Inject 0.6 mLs (60 mg total) into the skin every 12 (twelve) hours.   EPINEPHrine 0.3 mg/0.3 mL Soaj injection Commonly known as: EPI-PEN Inject 0.3 mg into the muscle as needed for anaphylaxis.   fluticasone 50 MCG/ACT nasal spray Commonly known as: FLONASE Place 1-2 sprays into both nostrils daily as needed for allergies.   fluticasone-salmeterol 250-50 MCG/ACT  Aepb Commonly known as: ADVAIR Inhale 1 puff into the lungs daily.   gabapentin 100 MG capsule Commonly known as: NEURONTIN Take 1 capsule (100 mg total) by mouth every 12 (twelve) hours for 5 days.   metoprolol tartrate 50 MG tablet Commonly known as: LOPRESSOR Take 100 mg by mouth daily.   nystatin cream Commonly known as: MYCOSTATIN Apply 1 Application topically 2 (two) times daily as needed (eczema).   ondansetron 4 MG disintegrating tablet Commonly known as: ZOFRAN-ODT Take 1 tablet (4 mg total) by mouth every 6 (six) hours as needed for nausea or vomiting.   pantoprazole 40 MG tablet Commonly known as: PROTONIX Take 1 tablet (40 mg total) by mouth daily.   SUMAtriptan 25 MG tablet Commonly known as: IMITREX Take 25 mg by mouth every 2 (two) hours as needed for migraine.   traMADol 50 MG tablet Commonly known as: ULTRAM Take 1 tablet (50 mg total) by mouth every 6 (six) hours as needed (pain).        Follow-up Information     Berna Bue, MD Follow up.   Specialty: General Surgery Contact information: [address] [place] [address] [phone]                  The results of significant diagnostics from this hospitalization (including imaging, microbiology, ancillary and laboratory) are listed below for reference.    Significant Diagnostic Studies: No results found.  Labs: Basic Metabolic Panel: Recent Labs  Lab 04/02/23 0501  NA 137  K 4.1  CL 104  CO2 24  GLUCOSE 106*  BUN 11  CREATININE 0.70  CALCIUM 9.0  MG 2.0   Liver Function Tests: Recent Labs  Lab 04/02/23 0501  AST 43*  ALT 56*  ALKPHOS 58  BILITOT 0.9  PROT 7.1  ALBUMIN 3.6    CBC: Recent Labs  Lab 04/02/23 0501  WBC 13.5*  HGB 12.0  HCT 36.0  MCV 95.0  PLT 399    CBG: No results for input(s): "GLUCAP" in the last 168 hours.  Principal Problem:   Morbid obesity (HCC)   Signed:  Berna Bue MD [facility] [phone] 04/02/2023, 8:12 AM

## 2023-04-04 LAB — SURGICAL PATHOLOGY

## 2023-04-08 ENCOUNTER — Telehealth (HOSPITAL_COMMUNITY): Payer: Self-pay | Admitting: *Deleted

## 2023-04-08 NOTE — Telephone Encounter (Signed)
 1. Tell me about your pain and pain management?     Pt denies any pain.    2. Let's talk about fluid intake. How much total fluid are you taking in?   Pt states that she is working to meet goal of 64 oz of fluid today, patient states on average she has been able to consume the daily goal if not more. Pt encouraged to continue to work towards meeting goal. Pt instructed to assess status and suggestions daily utilizing Hydration Action Plan on discharge folder and to call [facility] if in the "red zone".     3. How much protein have you taken in the last day?     Pt states she is working toward the goal of 60g of protein each day with the protein shakes.  Able to tolerate at least one vanilla protein shake a day, has had a time with tolerating protein shakes.    4. Have you had nausea? Tell me about when you have experienced nausea and what you did to help?   Pt denies nausea at this time. Patient only had nausea and one emesis episode associated with the protein shake a few days ago. Patient used the nausea medication to help and has since ran out of the nausea medication. Shared with patient to contact [facility] to update them of this.  5. Has the frequency or color changed with your urine?   Pt states that s/he is urinating "fine" with no changes in frequency or urgency.   6. Tell me what your incisions look like?   "Incisions look fine". Pt denies a fever, chills. Pt states incisions are not swollen, open, or draining. Pt encouraged to call [facility] if incisions change.  Patient did share that she has bruising all over due to the lovenox injections, patient has a noticeable large bruise on her upper left abdominal area - patient states it is larger than the size of her hand -- asked patient to take a picture of the bruising and to contact [facility] to provide this update  7. Have you been passing gas? BM?   Pt states that they are having BMs.   Pt states that they have had a BM. Pt instructed to take  either Miralax or MoM as instructed per "Gastric Bypass/Sleeve Discharge Home Care Instructions". Pt to call surgeon's office if not able to have BM with medication.      8. If a problem or question were to arise who would you call? Do you know contact numbers for [facility], [facility], and [facility]?   Pt knows to call [facility] for surgical, [facility] for nutrition, and [facility] for non-urgent questions or concerns. Pt denies dehydration symptoms. Pt can describe s/sx of dehydration.   9. How has the walking going?   Pt states s/he is walking around and able to be active without difficulty.   10. Are you still using your incentive spirometer? If so, how often?   Pt states that s/he is doing the I.S. Pt encouraged to use incentive spirometer, at least 10x every hour while awake until s/he sees the surgeon.   11. How are your vitamins and calcium going? How are you taking them?    Pt states that s/he is taking his/her supplements and vitamins without difficulty.    12. How has the anticoagulant Lovenox been going?   LOVENOX: Pt states that s/he is taking the Lovenox injections without difficulty. Reinforced education about taking injections q12h and rotating injection sites. Pt also instructed  to monitor for unusual bruising and/or signs of bleeding. See note above at #6

## 2023-04-13 ENCOUNTER — Encounter (HOSPITAL_COMMUNITY): Payer: Self-pay

## 2023-04-13 ENCOUNTER — Other Ambulatory Visit: Payer: Self-pay

## 2023-04-13 ENCOUNTER — Emergency Department (HOSPITAL_COMMUNITY)
Admission: EM | Admit: 2023-04-13 | Discharge: 2023-04-13 | Disposition: A | Discharge status: Home / Self Care | Attending: Emergency Medicine | Admitting: Emergency Medicine

## 2023-04-13 DIAGNOSIS — R1010 Upper abdominal pain, unspecified: Secondary | ICD-10-CM | POA: Diagnosis not present

## 2023-04-13 DIAGNOSIS — R42 Dizziness and giddiness: Secondary | ICD-10-CM | POA: Insufficient documentation

## 2023-04-13 DIAGNOSIS — Z91040 Latex allergy status: Secondary | ICD-10-CM | POA: Insufficient documentation

## 2023-04-13 DIAGNOSIS — I10 Essential (primary) hypertension: Secondary | ICD-10-CM | POA: Insufficient documentation

## 2023-04-13 DIAGNOSIS — E860 Dehydration: Secondary | ICD-10-CM | POA: Insufficient documentation

## 2023-04-13 LAB — COMPREHENSIVE METABOLIC PANEL
ALT: 79 U/L — ABNORMAL HIGH (ref 0–44)
AST: 49 U/L — ABNORMAL HIGH (ref 15–41)
Albumin: 3.8 g/dL (ref 3.5–5.0)
Alkaline Phosphatase: 52 U/L (ref 38–126)
Anion gap: 12 (ref 5–15)
BUN: 12 mg/dL (ref 6–20)
CO2: 21 mmol/L — ABNORMAL LOW (ref 22–32)
Calcium: 9.2 mg/dL (ref 8.9–10.3)
Chloride: 105 mmol/L (ref 98–111)
Creatinine, Ser: 0.81 mg/dL (ref 0.44–1.00)
GFR, Estimated: >60 mL/min (ref >60)
Glucose, Bld: 80 mg/dL (ref 70–99)
Potassium: 4.1 mmol/L (ref 3.5–5.1)
Sodium: 138 mmol/L (ref 135–145)
Total Bilirubin: 1.1 mg/dL (ref 0.0–1.2)
Total Protein: 7.1 g/dL (ref 6.5–8.1)

## 2023-04-13 LAB — CBC WITH DIFFERENTIAL/PLATELET
Abs Immature Granulocytes: 0.01 10*3/uL (ref 0.00–0.07)
Basophils Absolute: 0 10*3/uL (ref 0.0–0.1)
Basophils Relative: 1 %
Eosinophils Absolute: 0 10*3/uL (ref 0.0–0.5)
Eosinophils Relative: 1 %
HCT: 35.9 % — ABNORMAL LOW (ref 36.0–46.0)
Hemoglobin: 12.1 g/dL (ref 12.0–15.0)
Immature Granulocytes: 0 %
Lymphocytes Relative: 38 %
Lymphs Abs: 1.7 10*3/uL (ref 0.7–4.0)
MCH: 32.6 pg (ref 26.0–34.0)
MCHC: 33.7 g/dL (ref 30.0–36.0)
MCV: 96.8 fL (ref 80.0–100.0)
Monocytes Absolute: 0.7 10*3/uL (ref 0.1–1.0)
Monocytes Relative: 15 %
Neutro Abs: 2.1 10*3/uL (ref 1.7–7.7)
Neutrophils Relative %: 45 %
Platelets: 294 10*3/uL (ref 150–400)
RBC: 3.71 MIL/uL — ABNORMAL LOW (ref 3.87–5.11)
RDW: 12.4 % (ref 11.5–15.5)
WBC: 4.5 10*3/uL (ref 4.0–10.5)
nRBC: 0 % (ref 0.0–0.2)

## 2023-04-13 MED ORDER — LACTATED RINGERS IV BOLUS
1000.0000 mL | Freq: Once | INTRAVENOUS | Status: AC
  Administered 2023-04-13: 1000 mL via INTRAVENOUS

## 2023-04-13 MED ORDER — SODIUM CHLORIDE 0.9 % IV BOLUS
500.0000 mL | Freq: Once | INTRAVENOUS | Status: AC
  Administered 2023-04-13: 500 mL via INTRAVENOUS

## 2023-04-13 NOTE — ED Triage Notes (Signed)
 Pt had gastric sleeve surgery 2/25. Pt states she has had sob and chest pain intermittently since surgery and been evaluated. Today pt states she has been dizzy and had right sided abdominal pain.

## 2023-04-13 NOTE — ED Provider Notes (Signed)
 [place] EMERGENCY DEPARTMENT AT [facility] Provider Note   CSN: [id number] Arrival date & time: 04/13/23  1402     History  Chief Complaint  Patient presents with   Dizziness    Susan Butler is a 28 y.o. female.  HPI    28 year old female comes in with chief complaint of dizziness. Patient has history of hypertension, polycystic ovarian syndrome and is status post gastric sleeve procedure on 2-25.  Patient states that about 2 days after the surgery, she started having intermittent chest pain and shortness of breath.  The symptoms are unprovoked, and the last for few seconds.  Over the last few days, she has been having episodes of dizziness.  Dizziness is fairly constant now.  She had gone to her PCP recently, and was found to have blood pressure in the 70s.  Patient currently is only ingesting some protein shakes, and states that she is unable to keep all of the protein shakes down.  Patient was on birth control pills, but prior to the surgery it was discontinued. Pt has no hx of PE, DVT and denies any exogenous hormone (testosterone / estrogen) use, long distance travels or surgery in the past 6 weeks, active cancer, recent immobilization.  Patient's chest pain is generalized, is not pleuritic in nature and she denies any associated cough.  Symptoms will last for few minutes and resolved.  Review of system is negative for any double vision, difficulty in swallowing, slurred speech, one-sided weakness or numbness.  Patient has had some emesis intermittently, but is typically postprandial.   Home Medications Prior to Admission medications   Medication Sig Start Date End Date Taking? Authorizing Provider  acetaminophen (TYLENOL) 500 MG tablet Take 1,000 mg by mouth every 6 (six) hours as needed for moderate pain (pain score 4-6).    [provider]  albuterol (VENTOLIN HFA) 108 (90 Base) MCG/ACT inhaler Inhale 2 puffs into the lungs every 6 (six)  hours as needed for shortness of breath. 10/04/21   [provider]  benzonatate (TESSALON) 100 MG capsule Take 1 capsule (100 mg total) by mouth every 8 (eight) hours. Patient taking differently: Take 100 mg by mouth every 8 (eight) hours as needed for cough. 11/27/20   Redwine, Madison A, PA-C  docusate sodium (COLACE) 100 MG capsule Take 1 capsule (100 mg total) by mouth 2 (two) times daily. Okay to decrease to once daily or stop taking if having loose bowel movements 04/02/23 05/02/23  Berna Bue, MD  enoxaparin (LOVENOX) 60 MG/0.6ML injection Inject 0.6 mLs (60 mg total) into the skin every 12 (twelve) hours. 04/02/23 05/02/23  Berna Bue, MD  EPINEPHrine 0.3 mg/0.3 mL IJ SOAJ injection Inject 0.3 mg into the muscle as needed for anaphylaxis.    [provider]  fluticasone (FLONASE) 50 MCG/ACT nasal spray Place 1-2 sprays into both nostrils daily as needed for allergies. 01/20/23   [provider]  fluticasone-salmeterol (ADVAIR) 250-50 MCG/ACT AEPB Inhale 1 puff into the lungs daily.    [provider]  gabapentin (NEURONTIN) 100 MG capsule Take 1 capsule (100 mg total) by mouth every 12 (twelve) hours for 5 days. 04/02/23 04/07/23  Berna Bue, MD  JENCYCLA 0.35 MG tablet Take 1 tablet by mouth daily. 02/28/23   [provider]  metoprolol tartrate (LOPRESSOR) 50 MG tablet Take 100 mg by mouth daily. 08/15/21   [provider]  nystatin cream (MYCOSTATIN) Apply 1 Application topically 2 (two) times daily  as needed (eczema). 02/28/23   [provider]  ondansetron (ZOFRAN-ODT) 4 MG disintegrating tablet Dissolve 1 tablet (4 mg total) by mouth every 6 (six) hours as needed for nausea or vomiting. 04/02/23   Berna Bue, MD  pantoprazole (PROTONIX) 40 MG tablet Take 1 tablet (40 mg total) by mouth daily. 04/02/23   Berna Bue, MD  SUMAtriptan (IMITREX) 25 MG tablet Take 25 mg by mouth every 2 (two) hours as needed  for migraine.    [provider]  traMADol (ULTRAM) 50 MG tablet Take 1 tablet (50 mg total) by mouth every 6 (six) hours as needed (pain). 04/02/23   Berna Bue, MD      Allergies    Bee venom, Latex, Lithium, and Seroquel [quetiapine]    Review of Systems   Review of Systems  All other systems reviewed and are negative.   Physical Exam Updated Vital Signs BP 137/89   Pulse 67   Temp 98.4 F (36.9 C) (Oral)   Resp 17   Ht 5\' 10"  (1.778 m)   Wt (!) 151 kg   LMP 03/06/2023 (Approximate) Comment: Pregnancy test negative (-) on 04/01/23  SpO2 98%   BMI 47.78 kg/m  Physical Exam Vitals and nursing note reviewed.  Constitutional:      Appearance: She is well-developed.  HENT:     Head: Atraumatic.  Eyes:     Extraocular Movements: Extraocular movements intact.     Pupils: Pupils are equal, round, and reactive to light.  Cardiovascular:     Rate and Rhythm: Normal rate.     Heart sounds: No murmur heard.    Comments: Heart rate in the 80s Pulmonary:     Effort: Pulmonary effort is normal.     Breath sounds: No wheezing.  Abdominal:     Comments: Mild tenderness in the upper quadrant  Musculoskeletal:     Cervical back: Normal range of motion and neck supple.  Skin:    General: Skin is warm and dry.  Neurological:     Mental Status: She is alert and oriented to person, place, and time.     Cranial Nerves: No cranial nerve deficit.     Sensory: No sensory deficit.     Motor: No weakness.     Coordination: Coordination normal.     ED Results / Procedures / Treatments   Labs (all labs ordered are listed, but only abnormal results are displayed) Labs Reviewed  CBC WITH DIFFERENTIAL/PLATELET - Abnormal; Notable for the following components:      Result Value   RBC 3.71 (*)    HCT 35.9 (*)    All other components within normal limits  COMPREHENSIVE METABOLIC PANEL - Abnormal; Notable for the following components:   CO2 21 (*)    AST 49 (*)    ALT  79 (*)    All other components within normal limits  URINALYSIS, ROUTINE W REFLEX MICROSCOPIC    EKG EKG Interpretation Date/Time:  Sunday April 13 2023 15:30:32 EDT Ventricular Rate:  67 PR Interval:  172 QRS Duration:  88 QT Interval:  409 QTC Calculation: 432 R Axis:   33  Text Interpretation: Sinus rhythm ST elev, probable normal early repol pattern No acute changes subtle ST elevation noted in inferior and lateral leads Confirmed by Derwood Kaplan [phone]) on 04/13/2023 4:02:35 PM  Radiology No results found.  Procedures Procedures    Medications Ordered in ED Medications  lactated ringers bolus 1,000 mL (0 mLs  Intravenous Stopped 04/13/23 1714)  sodium chloride 0.9 % bolus 500 mL (500 mLs Intravenous New Bag/Given 04/13/23 1843)    ED Course/ Medical Decision Making/ A&P Clinical Course as of 04/13/23 1847  Wynelle Link Apr 13, 2023  1749 Patient reassessed.  He still feels slightly dizzy. However, her symptoms have improved. Her heart rate was in the 60s during my assessment.  Patient denies any chest pain, shortness of breath.  I ambulated the patient myself, she did not have any ataxia.  She did not feel like she was going to faint.  Plan is to get out of the finder cc bolus, and then we we will work on discharge with return precautions.  Patient is comfortable with this plan as well.  The plan has been communicated with the nurse.  I did discuss the lab results with the patient, indicated to her that we do not have any evidence of profound dehydration at this time and her orthostatic vital signs are also reassuring.  Her hemoglobin is also at baseline normal for her. [AN]    Clinical Course User Index [AN] Derwood Kaplan, MD                                 Medical Decision Making Amount and/or Complexity of Data Reviewed Labs: ordered.   This patient presents to the ED with chief complaint(s) of dizziness, intermittent episodes of shortness of breath and chest pain post  gastric sleeve surgery with pertinent past medical history of PCOS, elevated BMI and recent elective gastric sleeve procedure.The complaint involves an extensive differential diagnosis and also carries with it a high risk of complications and morbidity.    The differential diagnosis includes : Orthostatic dizziness, dehydration, electrolyte abnormality, medication toxicity/side effects, pulmonary embolism, stroke, vertebral dissection/basilar insufficiency  Patient's neurologic exam is reassuring.  No meningismus.  No recent head trauma.  Patient's cardiopulmonary exam is reassuring.  No hypoxia noted during my assessment of any tachypnea/tachycardia.  The initial plan is to get basic labs, orthostatic blood pressure.  Additional history obtained: Records reviewed  surgical note, patient's medications  Independent labs interpretation:  The following labs were independently interpreted: CBC, creatinine, electrolytes are all reassuring.  Treatment and Reassessment: Please see reassessment/workup tab   Final Clinical Impression(s) / ED Diagnoses Final diagnoses:  Dizziness  Dehydration    Rx / DC Orders ED Discharge Orders     None         Derwood Kaplan, MD 04/13/23 1847

## 2023-04-15 ENCOUNTER — Encounter: Payer: Self-pay | Admitting: Dietician

## 2023-04-15 ENCOUNTER — Encounter: Payer: 59 | Attending: Surgery | Admitting: Dietician

## 2023-04-15 ENCOUNTER — Telehealth (HOSPITAL_COMMUNITY): Payer: Self-pay | Admitting: *Deleted

## 2023-04-15 VITALS — Ht 70.0 in | Wt 342.0 lb

## 2023-04-15 DIAGNOSIS — E669 Obesity, unspecified: Secondary | ICD-10-CM | POA: Diagnosis present

## 2023-04-15 DIAGNOSIS — Z713 Dietary counseling and surveillance: Secondary | ICD-10-CM | POA: Diagnosis not present

## 2023-04-15 DIAGNOSIS — Z6841 Body Mass Index (BMI) 40.0 and over, adult: Secondary | ICD-10-CM | POA: Insufficient documentation

## 2023-04-15 NOTE — Telephone Encounter (Signed)
 Called patient to check back in following, recent ED visit.  Patient stated that she still has had some nausea but overall is feeling better. She is working towards her 64 oz of fluid daily intake. Patient hopeful that today when she goes to her dietician post-op class; advancing her diet will provide her with more variety to help avoid nausea. Her PCP took her off her metformin, and BP medication and patient stated that she feels a lot better as a result.   Thank you,  Lubertha Basque, RN, MSN Bariatric Nurse Coordinator [phone] (office)

## 2023-04-15 NOTE — Progress Notes (Signed)
 2 Week Post-Operative Nutrition Class   Patient was seen on 04/15/2023 for Post-Operative Nutrition education at the [facility].    Surgery date: 04/01/2023 Surgery type: Sleeve Gastrectomy  Anthropometrics  Start weight at [facility]: 359.4 lbs (date: 09/02/2022) Height: 70 in Weight today: 342.0 lb   Clinical   Medical hx: asthma, HTN, hypercholesterolemia, obesity, prediabetes Medications: metformin  Labs: A1c 5.7; Vit D 11.4, glucose 116, Ca 10.3 Notable signs/symptoms: none noted Any previous deficiencies? No Bowel Habits: Every day to every other day no complaints   The following the learning objectives were met by the patient during this course: Identifies Soft Prepped Plan Advancement Guide  Identifies Soft, High Proteins (Phase 1), beginning 2 weeks post-operatively to 3 weeks post-operatively Identifies Additional Soft High Proteins, soft non-starchy vegetables, fruits and starches (Phase 2), beginning 3 weeks post-operatively to 3 months post-operatively Identifies appropriate sources of fluids, proteins, vegetables, fruits and starches Identifies appropriate fat sources and healthy verses unhealthy fat types   States protein, vegetable, fruit and starch recommendations and appropriate sources post-operatively Identifies the need for appropriate texture modifications, mastication, and bite sizes when consuming solids Identifies appropriate fat consumption and sources Identifies appropriate multivitamin and calcium sources post-operatively Describes the need for physical activity post-operatively and will follow MD recommendations States when to call healthcare provider regarding medication questions or post-operative complications   Handouts given during class include: Soft Prepped Plan Advancement Guide   Follow-Up Plan: Patient will follow-up at [facility] in 10 weeks for 3 month post-op nutrition visit for diet advancement per MD.

## 2023-04-24 ENCOUNTER — Telehealth: Payer: Self-pay | Admitting: Dietician

## 2023-04-24 NOTE — Telephone Encounter (Signed)
 RD called pt to verify fluid intake once starting soft, solid proteins 2 week post-bariatric surgery.   Daily Fluid intake: 80 oz Daily Protein intake: 60 grams (still supplementing with protein shakes Bowel Habits: normal  Concerns/issues: pt states the only protein she can stomach is eggs and tuna. Dietitian encouraged pt to stick with phase one proteins (plant based, dairy, fish) as tolerated, and don't push the other meats. Pt states she is on her way to a follow-up appointment with her surgeon.

## 2023-05-14 ENCOUNTER — Encounter (HOSPITAL_COMMUNITY): Payer: Self-pay | Admitting: *Deleted

## 2023-05-14 ENCOUNTER — Emergency Department (HOSPITAL_COMMUNITY)

## 2023-05-14 ENCOUNTER — Emergency Department (HOSPITAL_COMMUNITY)
Admission: EM | Admit: 2023-05-14 | Discharge: 2023-05-14 | Discharge status: Against Medical Advice | Attending: Emergency Medicine | Admitting: Emergency Medicine

## 2023-05-14 ENCOUNTER — Other Ambulatory Visit: Payer: Self-pay

## 2023-05-14 DIAGNOSIS — W1839XA Other fall on same level, initial encounter: Secondary | ICD-10-CM | POA: Insufficient documentation

## 2023-05-14 DIAGNOSIS — Z5321 Procedure and treatment not carried out due to patient leaving prior to being seen by health care provider: Secondary | ICD-10-CM | POA: Diagnosis not present

## 2023-05-14 DIAGNOSIS — M25562 Pain in left knee: Secondary | ICD-10-CM | POA: Diagnosis present

## 2023-05-14 NOTE — ED Notes (Signed)
 Pt. Stated, "I've been here for 12 hours and want to leave". Moved to [employer]

## 2023-05-14 NOTE — ED Triage Notes (Addendum)
 Pt here for left knee pain after a fall last week. Pain woke her from sleep tonight Was seen at [facility] yesterday and had xrays. Reports orthopedics wanted to schedule MRI due to ligament injury. Had another fall tonight since having previous xrays Pt ambulatory with increased pain.

## 2023-05-22 ENCOUNTER — Encounter: Payer: Self-pay | Admitting: Orthopaedic Surgery

## 2023-05-23 ENCOUNTER — Other Ambulatory Visit: Payer: Self-pay | Admitting: Orthopaedic Surgery

## 2023-05-23 DIAGNOSIS — M25362 Other instability, left knee: Secondary | ICD-10-CM

## 2023-05-30 ENCOUNTER — Emergency Department (HOSPITAL_COMMUNITY)
Admission: EM | Admit: 2023-05-30 | Discharge: 2023-05-31 | Disposition: A | Discharge status: Home / Self Care | Attending: Emergency Medicine | Admitting: Emergency Medicine

## 2023-05-30 ENCOUNTER — Other Ambulatory Visit: Payer: Self-pay

## 2023-05-30 ENCOUNTER — Encounter (HOSPITAL_COMMUNITY): Payer: Self-pay | Admitting: *Deleted

## 2023-05-30 DIAGNOSIS — Z91040 Latex allergy status: Secondary | ICD-10-CM | POA: Insufficient documentation

## 2023-05-30 DIAGNOSIS — N939 Abnormal uterine and vaginal bleeding, unspecified: Secondary | ICD-10-CM | POA: Diagnosis not present

## 2023-05-30 DIAGNOSIS — R109 Unspecified abdominal pain: Secondary | ICD-10-CM | POA: Diagnosis present

## 2023-05-30 NOTE — ED Triage Notes (Signed)
 The pt is here with abdominal pain with vaginal bleeding since 1400  she last had a period 2 months ago she has not doe a pregnancy test

## 2023-05-31 ENCOUNTER — Emergency Department (HOSPITAL_COMMUNITY)

## 2023-05-31 DIAGNOSIS — N939 Abnormal uterine and vaginal bleeding, unspecified: Secondary | ICD-10-CM | POA: Diagnosis not present

## 2023-05-31 LAB — COMPREHENSIVE METABOLIC PANEL WITH GFR
ALT: 46 U/L — ABNORMAL HIGH (ref 0–44)
AST: 30 U/L (ref 15–41)
Albumin: 4 g/dL (ref 3.5–5.0)
Alkaline Phosphatase: 53 U/L (ref 38–126)
Anion gap: 14 (ref 5–15)
BUN: 14 mg/dL (ref 6–20)
CO2: 23 mmol/L (ref 22–32)
Calcium: 10.3 mg/dL (ref 8.9–10.3)
Chloride: 104 mmol/L (ref 98–111)
Creatinine, Ser: 1 mg/dL (ref 0.44–1.00)
GFR, Estimated: >60 mL/min (ref >60)
Glucose, Bld: 101 mg/dL — ABNORMAL HIGH (ref 70–99)
Potassium: 3.9 mmol/L (ref 3.5–5.1)
Sodium: 141 mmol/L (ref 135–145)
Total Bilirubin: 1.1 mg/dL (ref 0.0–1.2)
Total Protein: 7 g/dL (ref 6.5–8.1)

## 2023-05-31 LAB — URINALYSIS, ROUTINE W REFLEX MICROSCOPIC
Bilirubin Urine: NEGATIVE
Glucose, UA: NEGATIVE mg/dL
Ketones, ur: NEGATIVE mg/dL
Leukocytes,Ua: NEGATIVE
Nitrite: NEGATIVE
Protein, ur: 100 mg/dL — AB
RBC / HPF: >50 RBC/hpf (ref 0–5)
Specific Gravity, Urine: 1.038 — ABNORMAL HIGH (ref 1.005–1.030)
pH: 5 (ref 5.0–8.0)

## 2023-05-31 LAB — CBC
HCT: 41 % (ref 36.0–46.0)
Hemoglobin: 13.8 g/dL (ref 12.0–15.0)
MCH: 31.9 pg (ref 26.0–34.0)
MCHC: 33.7 g/dL (ref 30.0–36.0)
MCV: 94.9 fL (ref 80.0–100.0)
Platelets: 303 10*3/uL (ref 150–400)
RBC: 4.32 MIL/uL (ref 3.87–5.11)
RDW: 11.9 % (ref 11.5–15.5)
WBC: 6.5 10*3/uL (ref 4.0–10.5)
nRBC: 0 % (ref 0.0–0.2)

## 2023-05-31 LAB — WET PREP, GENITAL
Sperm: NONE SEEN
Trich, Wet Prep: NONE SEEN
WBC, Wet Prep HPF POC: <10 (ref <10)
Yeast Wet Prep HPF POC: NONE SEEN

## 2023-05-31 LAB — HCG, SERUM, QUALITATIVE: Preg, Serum: NEGATIVE

## 2023-05-31 LAB — LIPASE, BLOOD: Lipase: 39 U/L (ref 11–51)

## 2023-05-31 MED ORDER — ACETAMINOPHEN 500 MG PO TABS
1000.0000 mg | ORAL_TABLET | Freq: Once | ORAL | Status: AC
  Administered 2023-05-31: 1000 mg via ORAL
  Filled 2023-05-31: qty 2

## 2023-05-31 NOTE — ED Provider Notes (Signed)
 [place] EMERGENCY DEPARTMENT AT [facility] Provider Note   CSN: [id number] Arrival date & time: 05/30/23  2325     History  Chief Complaint  Patient presents with   Abdominal Pain    Susan Butler is a 28 y.o. female with history of PCOS and irregular menstrual cycles who presents with concern for heavy vaginal bleeding since 2 PM.  LMP was the first week of March, did have 2 days of spotting vaginally 2 weeks ago, but has not had her normal period this month.  She is sexually active with a single female partner without contraceptive but has not had no pregnancies in the past. She endorses sharp bilateral pelvic pain which is different from to her typical menstrual cycles.  No nausea vomiting fevers chills or vaginal discharge preceding symptoms.  Patient's primary concern is the new pain.  States that she initially was changing her tampon and pad every hour, though it is difficult to obtain clear history of frequency of changing hygiene products from this patient.  HPI     Home Medications Prior to Admission medications   Medication Sig Start Date End Date Taking? Authorizing Provider  acetaminophen  (TYLENOL ) 500 MG tablet Take 1,000 mg by mouth every 6 (six) hours as needed for moderate pain (pain score 4-6).    [provider]  albuterol  (VENTOLIN  HFA) 108 (90 Base) MCG/ACT inhaler Inhale 2 puffs into the lungs every 6 (six) hours as needed for shortness of breath. 10/04/21   [provider]  benzonatate  (TESSALON ) 100 MG capsule Take 1 capsule (100 mg total) by mouth every 8 (eight) hours. Patient taking differently: Take 100 mg by mouth every 8 (eight) hours as needed for cough. 11/27/20   Redwine, Madison A, PA-C  enoxaparin  (LOVENOX ) 60 MG/0.6ML injection Inject 0.6 mLs (60 mg total) into the skin every 12 (twelve) hours. 04/02/23 05/02/23  Adalberto Acton, MD  EPINEPHrine  0.3 mg/0.3 mL IJ SOAJ injection Inject 0.3 mg into the muscle as  needed for anaphylaxis.    [provider]  fluticasone  (FLONASE) 50 MCG/ACT nasal spray Place 1-2 sprays into both nostrils daily as needed for allergies. 01/20/23   [provider]  fluticasone -salmeterol (ADVAIR) 250-50 MCG/ACT AEPB Inhale 1 puff into the lungs daily.    [provider]  gabapentin  (NEURONTIN ) 100 MG capsule Take 1 capsule (100 mg total) by mouth every 12 (twelve) hours for 5 days. 04/02/23 04/07/23  Adalberto Acton, MD  JENCYCLA 0.35 MG tablet Take 1 tablet by mouth daily. 02/28/23   [provider]  metoprolol  tartrate (LOPRESSOR ) 50 MG tablet Take 100 mg by mouth daily. 08/15/21   [provider]  nystatin cream (MYCOSTATIN) Apply 1 Application topically 2 (two) times daily as needed (eczema). 02/28/23   [provider]  ondansetron  (ZOFRAN -ODT) 4 MG disintegrating tablet Dissolve 1 tablet (4 mg total) by mouth every 6 (six) hours as needed for nausea or vomiting. 04/02/23   Adalberto Acton, MD  pantoprazole  (PROTONIX ) 40 MG tablet Take 1 tablet (40 mg total) by mouth daily. 04/02/23   Adalberto Acton, MD  SUMAtriptan  (IMITREX ) 25 MG tablet Take 25 mg by mouth every 2 (two) hours as needed for migraine.    [provider]  traMADol  (ULTRAM ) 50 MG tablet Take 1 tablet (50 mg total) by mouth every 6 (six) hours as needed (pain). 04/02/23   Adalberto Acton, MD      Allergies    Bee venom,  Latex, Lithium, and Seroquel [quetiapine]    Review of Systems   Review of Systems  Genitourinary:  Positive for pelvic pain and vaginal bleeding.    Physical Exam Updated Vital Signs BP (!) 130/98 (BP Location: Left Wrist)   Pulse (!) 55   Temp 97.9 F (36.6 C)   Resp 18   Ht 5\' 10"  (1.778 m)   Wt (!) 155.1 kg   LMP 04/18/2023   SpO2 99%   BMI 49.06 kg/m  Physical Exam Vitals and nursing note reviewed. Exam conducted with a chaperone present (ED RN Tiffany).  Constitutional:      Appearance: She is obese. She is  not toxic-appearing.  HENT:     Head: Normocephalic and atraumatic.     Mouth/Throat:     Mouth: Mucous membranes are moist.     Pharynx: No oropharyngeal exudate or posterior oropharyngeal erythema.  Eyes:     General:        Right eye: No discharge.        Left eye: No discharge.     Conjunctiva/sclera: Conjunctivae normal.  Cardiovascular:     Rate and Rhythm: Normal rate and regular rhythm.     Pulses: Normal pulses.  Pulmonary:     Effort: Pulmonary effort is normal. No respiratory distress.     Breath sounds: Normal breath sounds. No wheezing or rales.  Abdominal:     General: Bowel sounds are normal. There is no distension.     Palpations: Abdomen is soft.     Tenderness: There is no abdominal tenderness. There is no right CVA tenderness, left CVA tenderness, guarding or rebound.  Genitourinary:    General: Normal vulva.     Vagina: Bleeding present.     Cervix: Cervical bleeding present. No erythema.     Adnexa:        Right: No tenderness.         Left: Tenderness present.      Comments: Bimanual exam limited by patient's body habitus; this provider was unable to palpate the cervix though it was visualized on speculum exam.  Some tenderness in the left adnexa on exam though I was unable to palpate for fullness. Musculoskeletal:        General: No deformity.     Cervical back: Neck supple.  Skin:    General: Skin is warm and dry.  Neurological:     Mental Status: She is alert. Mental status is at baseline.  Psychiatric:        Mood and Affect: Mood normal.     ED Results / Procedures / Treatments   Labs (all labs ordered are listed, but only abnormal results are displayed) Labs Reviewed  COMPREHENSIVE METABOLIC PANEL WITH GFR - Abnormal; Notable for the following components:      Result Value   Glucose, Bld 101 (*)    ALT 46 (*)    All other components within normal limits  URINALYSIS, ROUTINE W REFLEX MICROSCOPIC - Abnormal; Notable for the following  components:   Color, Urine AMBER (*)    APPearance HAZY (*)    Specific Gravity, Urine 1.038 (*)    Hgb urine dipstick LARGE (*)    Protein, ur 100 (*)    Bacteria, UA RARE (*)    All other components within normal limits  WET PREP, GENITAL  LIPASE, BLOOD  CBC  HCG, SERUM, QUALITATIVE  GC/CHLAMYDIA PROBE AMP ([place]) NOT AT [facility]    EKG None  Radiology No results  found.  Procedures Procedures    Medications Ordered in ED Medications - No data to display  ED Course/ Medical Decision Making/ A&P Clinical Course as of 05/31/23 0647  Sat May 31, 2023  0642 Abdominal pain. Has PCOS. 2 days of spotting. Bilateral pelvic pain. Pelvic exam was ok with some bleeding. Pelvic US .  [JR]    Clinical Course User Index [JR] Janalee Mcmurray, PA-C                                 Medical Decision Making 28 year old female presents with concern for heavy vaginal bleeding and pelvic pain.  Hypertensive on intake and vital signs otherwise normal.  Cardiopulmonary exam unremarkable, abdominal exam is benign.  GU exam as above.  Limited by body habitus. DDx includes but limited to menstrual bleeding, irregular uterine bleeding, ectopic pregnancy, ovarian torsion, STI.  Amount and/or Complexity of Data Reviewed Labs: ordered.    Details: CBC is unremarkable, CMP with mild ALT elevation to 46, UA with large hemoglobin but not convincing for infection in context of no urinary symptoms.  Lipase is normal.   Radiology: ordered.   Care of this patient signed out to oncoming ED provider Hanna Lewandowsky, PA-C at time of shift change. All pertinent HPI, physical exam, and laboratory findings were discussed with them prior to my departure. Disposition of patient pending completion of workup, reevaluation, and clinical judgement of oncoming ED provider.    This chart was dictated using voice recognition software, Dragon. Despite the best efforts of this provider to proofread and correct errors,  errors may still occur which can change documentation meaning.         Final Clinical Impression(s) / ED Diagnoses Final diagnoses:  None    Rx / DC Orders ED Discharge Orders     None         Arlyne Lame 05/31/23 1610    Lindle Rhea, MD 05/31/23 [phone]

## 2023-05-31 NOTE — Discharge Instructions (Addendum)
 Today was overall reassuring.  Suspect this is menstrual pain and bleeding.  Recommend Tylenol  and warm compresses at home.  Please follow-up with your OB/GYN provider.  If you have worsening abdominal pain, develop a fever, vaginal bleeding where you are soaking 1 pad per hour, shortness of breath fatigue or any other concerning symptom please return to the ED for further evaluation.

## 2023-05-31 NOTE — ED Provider Notes (Signed)
 Accepted handoff at shift change from [facility], PA-C. Please see prior provider note for more detail.   Briefly: Patient is 28 y.o. presenting for heavy vaginal bleeding and bilateral pelvic pain.  Has history of PCOS.  DDX: concern for menstrual bleeding, irregular uterine bleeding, ectopic pregnancy, ovarian torsion, STI   Plan: F/u on Pelvic U/S and reassess.  Likely discharge.  Physical Exam  BP (!) 167/87 (BP Location: Left Wrist)   Pulse (!) 55   Temp 97.9 F (36.6 C) (Oral)   Resp 18   Ht 5\' 10"  (1.778 m)   Wt (!) 155.1 kg   LMP 04/18/2023   SpO2 97%   BMI 49.06 kg/m   Physical Exam  Procedures  Procedures  ED Course / MDM   Clinical Course as of 05/31/23 0837  Sat May 31, 2023  0642 Abdominal pain. Has PCOS. 2 days of spotting. Bilateral pelvic pain. Pelvic exam was ok with some bleeding. Pelvic US . LMP first week of March.  [JR]    Clinical Course User Index [JR] Janalee Mcmurray, PA-C   Medical Decision Making Amount and/or Complexity of Data Reviewed Labs: ordered. Radiology: ordered.   Pelvic ultrasound was normal.   Treat her pelvic pain which she stated has eased without intervention with Tylenol .  Advised against NSAIDs that she has had a gastric bypass surgery.  Recommend warm compresses and Tylenol  at home.  Suspect this is menstrual cramping and bleeding.  Discussed return precautions.  Discharged good condition.  Recommended OB/GYN follow-up.     Janalee Mcmurray, PA-C 05/31/23 3875    Iva Mariner, MD 05/31/23 [phone]

## 2023-05-31 NOTE — ED Notes (Signed)
Patient taken to Ultrasound

## 2023-06-02 ENCOUNTER — Ambulatory Visit
Admission: RE | Admit: 2023-06-02 | Discharge: 2023-06-02 | Disposition: A | Discharge status: Home / Self Care | Source: Ambulatory Visit | Attending: Orthopaedic Surgery | Admitting: Orthopaedic Surgery

## 2023-06-02 DIAGNOSIS — M25362 Other instability, left knee: Secondary | ICD-10-CM

## 2023-06-02 LAB — GC/CHLAMYDIA PROBE AMP (~~LOC~~) NOT AT ARMC
Chlamydia: NEGATIVE
Comment: NEGATIVE
Comment: NORMAL
Neisseria Gonorrhea: NEGATIVE

## 2023-06-05 ENCOUNTER — Other Ambulatory Visit

## 2023-06-26 ENCOUNTER — Ambulatory Visit: Admitting: Dietician

## 2023-06-26 ENCOUNTER — Ambulatory Visit: Payer: 59 | Admitting: Dietician

## 2023-09-03 ENCOUNTER — Emergency Department (HOSPITAL_COMMUNITY)
Admission: EM | Admit: 2023-09-03 | Discharge: 2023-09-03 | Disposition: A | Discharge status: Home / Self Care | Attending: Emergency Medicine | Admitting: Emergency Medicine

## 2023-09-03 ENCOUNTER — Encounter (HOSPITAL_COMMUNITY): Payer: Self-pay | Admitting: *Deleted

## 2023-09-03 ENCOUNTER — Emergency Department (HOSPITAL_COMMUNITY)

## 2023-09-03 ENCOUNTER — Other Ambulatory Visit: Payer: Self-pay

## 2023-09-03 DIAGNOSIS — R110 Nausea: Secondary | ICD-10-CM | POA: Diagnosis present

## 2023-09-03 DIAGNOSIS — Z91040 Latex allergy status: Secondary | ICD-10-CM | POA: Diagnosis not present

## 2023-09-03 LAB — COMPREHENSIVE METABOLIC PANEL WITH GFR
ALT: 28 U/L (ref 0–44)
AST: 30 U/L (ref 15–41)
Albumin: 3.9 g/dL (ref 3.5–5.0)
Alkaline Phosphatase: 73 U/L (ref 38–126)
Anion gap: 11 (ref 5–15)
BUN: 9 mg/dL (ref 6–20)
CO2: 22 mmol/L (ref 22–32)
Calcium: 9.4 mg/dL (ref 8.9–10.3)
Chloride: 106 mmol/L (ref 98–111)
Creatinine, Ser: 0.97 mg/dL (ref 0.44–1.00)
GFR, Estimated: >60 mL/min (ref >60)
Glucose, Bld: 103 mg/dL — ABNORMAL HIGH (ref 70–99)
Potassium: 4 mmol/L (ref 3.5–5.1)
Sodium: 139 mmol/L (ref 135–145)
Total Bilirubin: 1 mg/dL (ref 0.0–1.2)
Total Protein: 6.8 g/dL (ref 6.5–8.1)

## 2023-09-03 LAB — CBC WITH DIFFERENTIAL/PLATELET
Abs Immature Granulocytes: 0.02 K/uL (ref 0.00–0.07)
Basophils Absolute: 0.1 K/uL (ref 0.0–0.1)
Basophils Relative: 1 %
Eosinophils Absolute: 0.1 K/uL (ref 0.0–0.5)
Eosinophils Relative: 1 %
HCT: 43.1 % (ref 36.0–46.0)
Hemoglobin: 14.5 g/dL (ref 12.0–15.0)
Immature Granulocytes: 0 %
Lymphocytes Relative: 28 %
Lymphs Abs: 3.2 K/uL (ref 0.7–4.0)
MCH: 31.3 pg (ref 26.0–34.0)
MCHC: 33.6 g/dL (ref 30.0–36.0)
MCV: 92.9 fL (ref 80.0–100.0)
Monocytes Absolute: 0.7 K/uL (ref 0.1–1.0)
Monocytes Relative: 6 %
Neutro Abs: 7.4 K/uL (ref 1.7–7.7)
Neutrophils Relative %: 64 %
Platelets: 382 K/uL (ref 150–400)
RBC: 4.64 MIL/uL (ref 3.87–5.11)
RDW: 12.1 % (ref 11.5–15.5)
WBC: 11.5 K/uL — ABNORMAL HIGH (ref 4.0–10.5)
nRBC: 0 % (ref 0.0–0.2)

## 2023-09-03 LAB — SARS CORONAVIRUS 2 BY RT PCR: SARS Coronavirus 2 by RT PCR: NEGATIVE

## 2023-09-03 LAB — HCG, SERUM, QUALITATIVE: Preg, Serum: NEGATIVE

## 2023-09-03 LAB — LIPASE, BLOOD: Lipase: 50 U/L (ref 11–51)

## 2023-09-03 MED ORDER — DIPHENHYDRAMINE HCL 50 MG/ML IJ SOLN
12.5000 mg | Freq: Once | INTRAMUSCULAR | Status: AC
  Administered 2023-09-03: 12.5 mg via INTRAVENOUS
  Filled 2023-09-03: qty 1

## 2023-09-03 MED ORDER — SODIUM CHLORIDE 0.9 % IV BOLUS
1000.0000 mL | Freq: Once | INTRAVENOUS | Status: AC
  Administered 2023-09-03: 1000 mL via INTRAVENOUS

## 2023-09-03 MED ORDER — ONDANSETRON 4 MG PO TBDP: 4.0000 mg | ORAL_TABLET | Freq: Four times a day (QID) | ORAL | 0 refills | Status: AC | PRN

## 2023-09-03 MED ORDER — METOCLOPRAMIDE HCL 5 MG/ML IJ SOLN
10.0000 mg | Freq: Once | INTRAMUSCULAR | Status: AC
  Administered 2023-09-03: 10 mg via INTRAVENOUS
  Filled 2023-09-03: qty 2

## 2023-09-03 MED ORDER — ONDANSETRON 4 MG PO TBDP: 4.0000 mg | ORAL_TABLET | Freq: Four times a day (QID) | ORAL | 0 refills | Status: DC | PRN

## 2023-09-03 MED ORDER — ONDANSETRON 4 MG PO TBDP
4.0000 mg | ORAL_TABLET | Freq: Once | ORAL | Status: AC
  Administered 2023-09-03: 4 mg via ORAL
  Filled 2023-09-03: qty 1

## 2023-09-03 NOTE — ED Triage Notes (Signed)
 The pt had sleeve place feb 25th by the surgeons  and 4 weeks ago the pt started getting nauseated 4 weeks ago  she has been seeing the surgeon  she has a rx of zofran  2 days that she does not have filled yet  lmp last month

## 2023-09-03 NOTE — ED Provider Triage Note (Signed)
 Emergency Medicine Provider Triage Evaluation Note  Susan Butler , a 28 y.o. female  was evaluated in triage.  Pt complains of ongoing nausea over the past 4 weeks.  States she has been able to eat, but just feels nauseous.  Reports she is having normal bowel movements.  Denies any abdominal pain, fever, or chills.  Review of Systems  Positive: As above Negative: As above  Physical Exam  Ht 5' 10 (1.778 m)   Wt (!) 155.1 kg   BMI 49.06 kg/m  Gen:   Awake, no distress   Resp:  Normal effort  MSK:   Moves extremities without difficulty  Other:  Abdomen soft and nontender to palpation  Medical Decision Making  Medically screening exam initiated at 5:25 PM.  Appropriate orders placed.  Karysa Macie Finfrock was informed that the remainder of the evaluation will be completed by another provider, this initial triage assessment does not replace that evaluation, and the importance of remaining in the ED until their evaluation is complete.     Veta Palma, PA-C 09/03/23 1726

## 2023-09-03 NOTE — Discharge Instructions (Signed)
 You were seen for your nausea in the emergency department.   At home, please take the Zofran  prescribed you.    Check your MyChart online for the results of any tests that had not resulted by the time you left the emergency department.   Follow-up with your primary doctor in 2-3 days regarding your visit.    Return immediately to the emergency department if you experience any of the following: Vomiting, severe abdominal pain, or any other concerning symptoms.    Thank you for visiting our Emergency Department. It was a pleasure taking care of you today.

## 2023-09-03 NOTE — ED Provider Notes (Signed)
 [place] EMERGENCY DEPARTMENT AT [facility] Provider Note   CSN: [id number] Arrival date & time: 09/03/23  1706     Patient presents with: No chief complaint on file.   Susan Butler is a 28 y.o. female.   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  Patient reports that for over a month she has been experiencing intermittent bouts of nausea.  Says that today she felt weak and nauseous and started having some tremors of her hands.  Took some Zofran  at home but still feeling nauseous so decided to come in.  Has not actually vomited.  No significant abdominal pain.  No diarrhea or changes to her bowel movements recently.  Still passing gas.       Prior to Admission medications   Medication Sig Start Date End Date Taking? Authorizing Provider  acetaminophen  (TYLENOL ) 500 MG tablet Take 1,000 mg by mouth every 6 (six) hours as needed for moderate pain (pain score 4-6).    [provider]  albuterol  (VENTOLIN  HFA) 108 (90 Base) MCG/ACT inhaler Inhale 2 puffs into the lungs every 6 (six) hours as needed for shortness of breath. 10/04/21   [provider]  benzonatate  (TESSALON ) 100 MG capsule Take 1 capsule (100 mg total) by mouth every 8 (eight) hours. Patient taking differently: Take 100 mg by mouth every 8 (eight) hours as needed for cough. 11/27/20   Redwine, Madison A, PA-C  enoxaparin  (LOVENOX ) 60 MG/0.6ML injection Inject 0.6 mLs (60 mg total) into the skin every 12 (twelve) hours. 04/02/23 05/02/23  Signe Mitzie LABOR, MD  EPINEPHrine  0.3 mg/0.3 mL IJ SOAJ injection Inject 0.3 mg into the muscle as needed for anaphylaxis.    [provider]  fluticasone  (FLONASE) 50 MCG/ACT nasal spray Place 1-2 sprays into both nostrils daily as needed for allergies. 01/20/23   [provider]  fluticasone -salmeterol (ADVAIR) 250-50 MCG/ACT AEPB Inhale 1 puff into the lungs daily.     [provider]  gabapentin  (NEURONTIN ) 100 MG capsule Take 1 capsule (100 mg total) by mouth every 12 (twelve) hours for 5 days. 04/02/23 04/07/23  Signe Mitzie LABOR, MD  JENCYCLA 0.35 MG tablet Take 1 tablet by mouth daily. 02/28/23   [provider]  metoprolol  tartrate (LOPRESSOR ) 50 MG tablet Take 100 mg by mouth daily. 08/15/21   [provider]  nystatin cream (MYCOSTATIN) Apply 1 Application topically 2 (two) times daily as needed (eczema). 02/28/23   [provider]  ondansetron  (ZOFRAN -ODT) 4 MG disintegrating tablet Dissolve 1 tablet (4 mg total) by mouth every 6 (six) hours as needed for nausea or vomiting. 09/03/23   Yolande Lamar BROCKS, MD  pantoprazole  (PROTONIX ) 40 MG tablet Take 1 tablet (40 mg total) by mouth daily. 04/02/23   Signe Mitzie LABOR, MD  SUMAtriptan  (IMITREX ) 25 MG tablet Take 25 mg by mouth every 2 (two) hours as needed for migraine.    [provider]  traMADol  (ULTRAM ) 50 MG tablet Take 1 tablet (50 mg total) by mouth every 6 (six) hours as needed (pain). 04/02/23   Signe Mitzie LABOR, MD    Allergies: Bee venom, Latex, Lithium, and Seroquel [quetiapine]    Review of Systems  Updated Vital Signs BP (!) 148/85   Pulse 100   Temp 98.6 F (37 C)   Resp 18   Ht 5' 10 (1.778 m)   Wt (!) 155.1 kg   LMP 08/23/2023   SpO2 100%  BMI 49.06 kg/m   Physical Exam Vitals and nursing note reviewed.  Constitutional:      General: She is not in acute distress.    Appearance: She is well-developed.  Abdominal:     General: Abdomen is flat. There is no distension.     Palpations: Abdomen is soft. There is no mass.     Tenderness: There is no abdominal tenderness. There is no guarding.  Musculoskeletal:     Cervical back: Normal range of motion and neck supple.  Skin:    General: Skin is warm and dry.  Neurological:     Mental Status: She is alert.  Psychiatric:        Mood and Affect: Mood normal.     (all labs ordered  are listed, but only abnormal results are displayed) Labs Reviewed  CBC WITH DIFFERENTIAL/PLATELET - Abnormal; Notable for the following components:      Result Value   WBC 11.5 (*)    All other components within normal limits  COMPREHENSIVE METABOLIC PANEL WITH GFR - Abnormal; Notable for the following components:   Glucose, Bld 103 (*)    All other components within normal limits  SARS CORONAVIRUS 2 BY RT PCR  HCG, SERUM, QUALITATIVE  LIPASE, BLOOD  URINALYSIS, ROUTINE W REFLEX MICROSCOPIC    EKG: EKG Interpretation Date/Time:  Wednesday September 03 2023 21:58:39 EDT Ventricular Rate:  64 PR Interval:  184 QRS Duration:  104 QT Interval:  448 QTC Calculation: 463 R Axis:   54  Text Interpretation: Sinus rhythm Low voltage, precordial leads Confirmed by Yolande Charleston [phone]) on 09/03/2023 10:51:04 PM  Radiology: [facility] Abd 2 Views Result Date: 09/03/2023 CLINICAL DATA:  Nausea and vomiting. EXAM: ABDOMEN - 2 VIEW COMPARISON:  Upper GI 08/21/2022 FINDINGS: Gas and stool scattered throughout the colon. No small or large bowel distention. No abnormal air-fluid levels. No free intra-abdominal air. No radiopaque stones. Surgical clips in the left upper quadrant. Vascular calcifications. Visualized bones and soft tissue contours appear intact. Lung bases are clear. IMPRESSION: Normal nonobstructive bowel gas pattern.  No free air. Electronically Signed   By: Elsie Gravely M.D.   On: 09/03/2023 21:31     Procedures   Medications Ordered in the ED  ondansetron  (ZOFRAN -ODT) disintegrating tablet 4 mg (4 mg Oral Given 09/03/23 1736)  metoCLOPramide  (REGLAN ) injection 10 mg (10 mg Intravenous Given 09/03/23 2148)  diphenhydrAMINE  (BENADRYL ) injection 12.5 mg (12.5 mg Intravenous Given 09/03/23 2147)  sodium chloride  0.9 % bolus 1,000 mL (0 mLs Intravenous Stopped 09/03/23 2259)                                    Medical Decision Making Amount and/or Complexity of Data  Reviewed Radiology: ordered.  Risk Prescription drug management.   28 year old female with a history of gastric sleeve presents emergency department nausea  Initial Ddx:  Bowel obstruction, gastroenteritis, nausea, intra-abdominal infection  MDM/Course:  Patient presents emergency department with nausea.  Does have a history of a gastric sleeve.  No vomiting.  No significant abdominal distention and still passing gas so feel that SBO is unlikely.  She had an abdominal x-ray that did not show signs of obstruction.  Lab work was unremarkable.  She was given Zofran  and upon re-evaluation was tolerating p.o.  Will have her follow-up with her primary doctor in several days.  Given a prescription of Zofran  to take at home.  This patient presents to the ED for concern of complaints listed in HPI, this involves an extensive number of treatment options, and is a complaint that carries with it a high risk of complications and morbidity. Disposition including potential need for admission considered.   Dispo: DC Home. Return precautions discussed including, but not limited to, those listed in the AVS. Allowed pt time to ask questions which were answered fully prior to dc.  Additional history obtained from spouse Records reviewed Outpatient Clinic Notes The following labs were independently interpreted: Chemistry and show no acute abnormality I independently reviewed the following imaging with scope of interpretation limited to determining acute life threatening conditions related to emergency care: abdominal x-ray and agree with the radiologist interpretation with the following exceptions: none I personally reviewed and interpreted cardiac monitoring: normal sinus rhythm  I personally reviewed and interpreted the pt's EKG: see above for interpretation  I have reviewed the patients home medications and made adjustments as needed  Portions of this note were generated with Dragon dictation software.  Dictation errors may occur despite best attempts at proofreading.     Final diagnoses:  Nausea    ED Discharge Orders          Ordered    ondansetron  (ZOFRAN -ODT) 4 MG disintegrating tablet  Every 6 hours PRN,   Status:  Discontinued        09/03/23 2251    ondansetron  (ZOFRAN -ODT) 4 MG disintegrating tablet  Every 6 hours PRN        09/03/23 2251               Yolande Lamar BROCKS, MD 09/03/23 2351

## 2023-09-03 NOTE — ED Notes (Signed)
 Pt informed to please keep monitors on while being treated. Pt pulled all monitor cords off. Pt was asked to PO challenge and pt stated no pt husband said she drank earlier.
